# Patient Record
Sex: Female | Born: 1955 | Race: White | Hispanic: No | Marital: Married | Smoking: Never smoker
Health system: Southern US, Community
[De-identification: ages and names within clinical notes are randomized; demographics above are authoritative.]

---

## 2000-01-04 ENCOUNTER — Encounter: Payer: Self-pay | Admitting: Obstetrics and Gynecology

## 2000-01-04 ENCOUNTER — Encounter: Admission: RE | Admit: 2000-01-04 | Discharge: 2000-01-04 | Payer: Self-pay | Admitting: Obstetrics and Gynecology

## 2000-09-04 ENCOUNTER — Other Ambulatory Visit: Admission: RE | Admit: 2000-09-04 | Discharge: 2000-09-04 | Payer: Self-pay | Admitting: Obstetrics and Gynecology

## 2000-09-04 ENCOUNTER — Encounter (INDEPENDENT_AMBULATORY_CARE_PROVIDER_SITE_OTHER): Payer: Self-pay | Admitting: Specialist

## 2001-01-06 ENCOUNTER — Encounter: Admission: RE | Admit: 2001-01-06 | Discharge: 2001-01-06 | Payer: Self-pay | Admitting: Obstetrics and Gynecology

## 2001-01-06 ENCOUNTER — Encounter: Payer: Self-pay | Admitting: Obstetrics and Gynecology

## 2002-01-19 ENCOUNTER — Encounter: Payer: Self-pay | Admitting: Obstetrics and Gynecology

## 2002-01-19 ENCOUNTER — Encounter: Admission: RE | Admit: 2002-01-19 | Discharge: 2002-01-19 | Payer: Self-pay | Admitting: Obstetrics and Gynecology

## 2003-02-01 ENCOUNTER — Encounter: Admission: RE | Admit: 2003-02-01 | Discharge: 2003-02-01 | Payer: Self-pay | Admitting: Obstetrics and Gynecology

## 2004-02-06 ENCOUNTER — Encounter: Admission: RE | Admit: 2004-02-06 | Discharge: 2004-02-06 | Payer: Self-pay | Admitting: Obstetrics and Gynecology

## 2005-03-12 ENCOUNTER — Encounter: Admission: RE | Admit: 2005-03-12 | Discharge: 2005-03-12 | Payer: Self-pay | Admitting: Obstetrics and Gynecology

## 2006-03-13 ENCOUNTER — Encounter: Admission: RE | Admit: 2006-03-13 | Discharge: 2006-03-13 | Payer: Self-pay | Admitting: Obstetrics and Gynecology

## 2007-01-12 DIAGNOSIS — D649 Anemia, unspecified: Secondary | ICD-10-CM | POA: Insufficient documentation

## 2007-01-12 DIAGNOSIS — I4949 Other premature depolarization: Secondary | ICD-10-CM | POA: Insufficient documentation

## 2007-03-27 ENCOUNTER — Encounter: Admission: RE | Admit: 2007-03-27 | Discharge: 2007-03-27 | Payer: Self-pay | Admitting: Obstetrics and Gynecology

## 2008-08-15 ENCOUNTER — Encounter: Admission: RE | Admit: 2008-08-15 | Discharge: 2008-08-15 | Payer: Self-pay | Admitting: Family Medicine

## 2010-02-20 ENCOUNTER — Ambulatory Visit: Payer: Self-pay | Admitting: Pulmonary Disease

## 2010-02-20 DIAGNOSIS — G4700 Insomnia, unspecified: Secondary | ICD-10-CM | POA: Insufficient documentation

## 2010-04-24 NOTE — Assessment & Plan Note (Signed)
Summary: consult for insomnia   Visit Type:  Initial Consult Copy to:  Lavada Mesi MD Primary Provider/Referring Provider:  Jim Like MD  CC:  Sleep Consult. pt c/o having trouble falling asleep and staying asleep but it's not everynight. .  History of Present Illness: The pt is a 54y/o female who I have been asked to see for insomnia.  The pt has had issues with this for years, and states that she "cannot turn off brain".  She works 10 hr shifts which start at 8am or 11am, and will typically go to bed at 11pm and arise btw 6-8am.  She usually is not sleepy when she goes to bed, and takes an [place] and xanax everynight to help initiate sleep.  She can get to sleep quickly with meds on some nights, but others not.  She will awaken at least one time a night, then cannot get back to sleep at all.  She will typically stay in bed, and admits that she has a sense of frustration with trying to get to sleep.  She unfortunately will read and watch tv in bed.  She is not rested upon arising, but does not nap during the day.  She does drink 3 caffeinated beverages each am, but none after lunch.  She will occasionally doze during the evening, but not often.  Her epworth score today is only 6.  She does try and get exercise with walking 2-3 times a week.  She denies any environmental issues with her bedroom, chronic pain, but does have hot flashes of menopause.  She denies snoring and symptoms of RLS.    Preventive Screening-Counseling & Management  Alcohol-Tobacco     Smoking Status: never  Current Medications (verified): 1)  Voltaren-Xr 100 Mg Xr24h-Tab (Diclofenac Sodium) .... 75 Mg Two Times A Day 2)  Doxycycline Hyclate 100 Mg Caps (Doxycycline Hyclate) .... Once Daily 3)  Mega Red 300 Mg .... Once Daily 4)  Xanax 1 Mg Tabs (Alprazolam) .Marland Kitchen.. 1-2 Tablets As Needed 5)  Ambien 5 Mg Tabs (Zolpidem Tartrate) .... Once Daily At Bedtime  Allergies (verified): No Known Drug Allergies  Past  History:  Past Medical History: Arthritis  Past Surgical History: none  Family History: Reviewed history and no changes required. kidney cancer--father throat cancer--father htn--mother  Social History: Reviewed history and no changes required. occupation--pharmacist married Patient never smoked.  Smoking Status:  never  Review of Systems       The patient complains of tooth/dental problems, anxiety, and joint stiffness or pain.  The patient denies shortness of breath with activity, shortness of breath at rest, productive cough, non-productive cough, coughing up blood, chest pain, irregular heartbeats, acid heartburn, indigestion, loss of appetite, weight change, abdominal pain, difficulty swallowing, sore throat, headaches, nasal congestion/difficulty breathing through nose, sneezing, itching, ear ache, depression, hand/feet swelling, rash, change in color of mucus, and fever.    Vital Signs:  Patient profile:   55 year old female Height:      65.5 inches Weight:      146.25 pounds BMI:     24.05 O2 Sat:      100 % on Room air Temp:     98.2 degrees F oral Pulse rate:   65 / minute BP sitting:   122 / 80  (left arm) Cuff size:   large  Vitals Entered By: Carver Fila (February 20, 2010 9:05 AM)  O2 Flow:  Room air CC: Sleep Consult. pt c/o having trouble falling asleep and  staying asleep but it's not everynight.  Comments meds and allergies updated Phone number updated Carver Fila  February 20, 2010 9:06 AM    Physical Exam  General:  wd female in nad Eyes:  PERRLA and EOMI.   Nose:  mild narrowing on right, o/w clear Mouth:  no exudates or lesions seen Neck:  no jvd, tmg, LN Lungs:  totally clear to auscultation Heart:  rrr, no mrg Abdomen:  soft and nontender, bs+ Extremities:  no edema or cyanosis, pulses intact distally Neurologic:  alert and oriented, moves all 4.   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  She does work shifts, but does not overlap into 3rd shift.  I have had a long discussion with pt about insomnia, and have told her that medications are not the answer.  This will typically require behavioral therapy, and a lot of discipline on her part.  Will try to get her off ambien and xanax, using trazodone short term in the interim.  I have reviewed ritualistic behaviors and stimulus control therapy with her, and asked her to go to bed later than usual because she is not sleepy at her usual time.  I have also gone over what constitutes good sleep hygiene.  Finally, I have stressed to her this will be a slow process with small improvements over time.  If she continues to have issues, she may need consultation with a behavioral therapist for CBT (cognitive behavioral therapy).  Medications Added to Medication List This Visit: 1)  Voltaren-xr 100 Mg Xr24h-tab (Diclofenac sodium) .... 75 mg two times a day 2)  Doxycycline Hyclate 100 Mg Caps (Doxycycline hyclate) .... Once daily 3)  Mega Red 300 Mg  .... Once daily 4)  Xanax 1 Mg Tabs (Alprazolam) .Marland Kitchen.. 1-2 tablets as needed 5)  Ambien 5 Mg Tabs (Zolpidem tartrate) .... Once daily at bedtime 6)  Trazodone Hcl 50 Mg Tabs (Trazodone hcl) .... At bedtime  Other Orders: Consultation Level V (13086)  Patient Instructions: 1)  no tv or reading in bed ever. 2)  do not stay in bed more than if you cannot initiate sleep 3)  ritualistic behaviors for before going to bed to help "turn off brain" 4)  make bedtime 12mn, and get up everyday no later than 7am 5)  no caffeine after 10am 6)  stop ambien now, and take trazodone 50mg  at bedtime for next 3 weeks. 7)  decrease xanax to 0.5 each night for a week, then every other day for a week, then stop 8)  no napping during day, and exercise 3-4 hours before bedtime. 9)  call me in 3 weeks with your  progress.  Prescriptions: TRAZODONE HCL 50 MG  TABS (TRAZODONE HCL) At bedtime  #21 x 0   Entered and Authorized by:   Barbaraann Share MD   Signed by:   Barbaraann Share MD on 02/20/2010   Method used:   Print then Give to Patient   RxID:   [id number]

## 2011-08-08 DIAGNOSIS — N951 Menopausal and female climacteric states: Secondary | ICD-10-CM | POA: Insufficient documentation

## 2016-01-03 ENCOUNTER — Ambulatory Visit (INDEPENDENT_AMBULATORY_CARE_PROVIDER_SITE_OTHER): Payer: Self-pay | Admitting: Orthopedic Surgery

## 2016-01-24 ENCOUNTER — Ambulatory Visit (INDEPENDENT_AMBULATORY_CARE_PROVIDER_SITE_OTHER): Payer: 59 | Admitting: Orthopedic Surgery

## 2016-01-24 ENCOUNTER — Ambulatory Visit (INDEPENDENT_AMBULATORY_CARE_PROVIDER_SITE_OTHER): Payer: 59

## 2016-01-24 ENCOUNTER — Encounter (INDEPENDENT_AMBULATORY_CARE_PROVIDER_SITE_OTHER): Payer: Self-pay | Admitting: Orthopedic Surgery

## 2016-01-24 VITALS — BP 140/84 | Ht 65.5 in | Wt 138.0 lb

## 2016-01-24 DIAGNOSIS — M1711 Unilateral primary osteoarthritis, right knee: Secondary | ICD-10-CM

## 2016-01-24 DIAGNOSIS — M25562 Pain in left knee: Secondary | ICD-10-CM

## 2016-01-24 MED ORDER — LIDOCAINE HCL 1 % IJ SOLN
5.0000 mL | INTRAMUSCULAR | Status: AC | PRN
  Administered 2016-01-24: 5 mL

## 2016-01-24 MED ORDER — METHYLPREDNISOLONE ACETATE 40 MG/ML IJ SUSP
40.0000 mg | INTRAMUSCULAR | Status: AC | PRN
  Administered 2016-01-24: 40 mg via INTRA_ARTICULAR

## 2016-01-24 MED ORDER — BUPIVACAINE HCL 0.25 % IJ SOLN
4.0000 mL | INTRAMUSCULAR | Status: AC | PRN
  Administered 2016-01-24: 4 mL via INTRA_ARTICULAR

## 2016-01-24 NOTE — Progress Notes (Signed)
Office Visit Note   Patient: Kristen CoreBarbara M Lovan           Date of Birth: 1956/02/12           MRN: [id number] Visit Date: 01/24/2016 Requested by: No referring provider defined for this encounter. PCP: Jim LikeBenjamin Wilson, MD  Subjective: Chief Complaint  Patient presents with  . Left Knee - Pain  . 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  She did working about 32 hours a week standing on both of her legs.  She states that she's been favoring the left knee and reports some focal anterolateral patellar type pain which is preventing her from kneeling.  Denies any mechanical symptoms.  She's been using Voltaren gel.  She has injection history well documented in the next sections  Patient comes in complaining of bilateral knee pain, R > L.  The right knee was last aspirated and injected with cortisone on 11/09/14, and Monovisc was last injected on 09/28/14.  The right knee still swells intermittently, she tolerates it.  She takes ultram for the pain.  Has put off coming for eval due to high insurance deductible.  The left knee hurts anterior/lateral, some pain, some swelling.  Very tender spot on knee, that is she kneels it is severely painful.  She wonders if it is just bruised, but does not remember any injury to it.  She favors the left knee due to the right knee pain.  Neither knee is giving way.                  Review of Systems all systems reviewed negative as they relate to the chief complaint.  No fevers or chills   Assessment & Plan: Visit Diagnoses: No diagnosis found.  Plan: Impression is progressive right knee arthritis but with maintenance of joint space and no effusion in the right knee.  She also is having some left knee pain which looks to be less arthritic and degenerative potentially more strangulated of tissue attachment site on that superolateral patella.  Did inject  the knee today and we'll continue with nonoperative management for now  Follow-Up Instructions: No Follow-up on file.   Orders:  No orders of the defined types were placed in this encounter.  No orders of the defined types were placed in this encounter.     Procedures: Large Joint Inj Date/Time: 01/24/2016 8:59 AM Performed by: Cammy CopaEAN, SCOTT GREGORY Authorized by: Cammy CopaEAN, SCOTT GREGORY   Consent Given by:  Patient Site marked: the procedure site was marked   Timeout: prior to procedure the correct patient, procedure, and site was verified   Indications:  Pain, joint swelling and diagnostic evaluation Location:  Knee Site:  R knee Prep: patient was prepped and draped in usual sterile fashion   Needle Size:  18 G Needle Length:  1.5 inches Approach:  Superolateral Ultrasound Guidance: No   Fluoroscopic Guidance: No   Arthrogram: No Medications:  5 mL lidocaine 1 %; 4 mL bupivacaine 0.25 %; 40 mg methylPREDNISolone acetate 40 MG/ML Aspiration Attempted: No   Patient tolerance:  Patient tolerated the procedure well with no immediate complications     Clinical Data: No additional findings.  Objective: Vital Signs: BP 140/84   Ht 5' 5.5" (1.664 m)   Wt 138 lb (62.6 kg)   BMI 22.62 kg/m   Physical Exam  Constitutional: She appears well-developed.  HENT:  Head: Normocephalic.  Eyes: EOM are normal.  Neck: Normal range of motion.  Cardiovascular: Normal rate.   Pulmonary/Chest: Effort normal.  Neurological: She is alert.  Skin: Skin is warm.  Psychiatric: She has a normal mood and affect.    Ortho Exam examination of both knees demonstrates no effusion full range of motion stable collateral cruciate ligament bilaterally palpable pedal pulses bilaterally no groin pain with internal/external rotation of either leg.  Does have focal tenderness superior lateral aspect of the patella on the left.  Negative Tinel's in this region.  No other masses lymph adenopathy or skin  changes noted in the right or left knee region.  There is no effusion in either knee.  No joint line tenderness is present on the right with mild patellofemoral crepitus more on the right than the left  Specialty Comments:  No specialty comments available.  Imaging: No results found.   PMFS History: Patient Active Problem List   Diagnosis Date Noted  . PERSISTENT DISORDER INITIATING/MAINTAINING SLEEP 02/20/2010   No past medical history on file.  No family history on file.  No past surgical history on file. Social History   Occupational History  . Not on file.   Social History Main Topics  . Smoking status: Never Smoker  . Smokeless tobacco: Never Used  . Alcohol use 0.6 oz/week    1 Glasses of wine per week     Comment: occas with dinner  . Drug use: No  . Sexual activity: Not on file

## 2016-01-30 ENCOUNTER — Other Ambulatory Visit (INDEPENDENT_AMBULATORY_CARE_PROVIDER_SITE_OTHER): Payer: Self-pay | Admitting: Orthopedic Surgery

## 2016-01-30 NOTE — Telephone Encounter (Signed)
Dean patient,can you advise ok to refill?

## 2016-01-31 NOTE — Telephone Encounter (Signed)
I called pharm [facility] for them, Rx did not send

## 2016-04-29 ENCOUNTER — Other Ambulatory Visit (INDEPENDENT_AMBULATORY_CARE_PROVIDER_SITE_OTHER): Payer: Self-pay | Admitting: Orthopedic Surgery

## 2016-04-29 NOTE — Telephone Encounter (Signed)
Rx request 

## 2016-05-10 ENCOUNTER — Other Ambulatory Visit (INDEPENDENT_AMBULATORY_CARE_PROVIDER_SITE_OTHER): Payer: Self-pay | Admitting: Orthopedic Surgery

## 2016-05-10 NOTE — Telephone Encounter (Signed)
Rx request 

## 2016-06-28 ENCOUNTER — Other Ambulatory Visit (INDEPENDENT_AMBULATORY_CARE_PROVIDER_SITE_OTHER): Payer: Self-pay | Admitting: Orthopedic Surgery

## 2016-06-28 NOTE — Telephone Encounter (Signed)
Rx requests 

## 2016-06-28 NOTE — Telephone Encounter (Signed)
IC tramadol Rx in to her pharm

## 2016-08-07 ENCOUNTER — Ambulatory Visit (INDEPENDENT_AMBULATORY_CARE_PROVIDER_SITE_OTHER): Payer: 59 | Admitting: Orthopedic Surgery

## 2016-08-07 ENCOUNTER — Encounter (INDEPENDENT_AMBULATORY_CARE_PROVIDER_SITE_OTHER): Payer: Self-pay | Admitting: Orthopedic Surgery

## 2016-08-07 DIAGNOSIS — G8929 Other chronic pain: Secondary | ICD-10-CM | POA: Diagnosis not present

## 2016-08-07 DIAGNOSIS — M25561 Pain in right knee: Secondary | ICD-10-CM

## 2016-08-07 MED ORDER — LIDOCAINE HCL 1 % IJ SOLN
5.0000 mL | INTRAMUSCULAR | Status: AC | PRN
  Administered 2016-08-07: 5 mL

## 2016-08-07 MED ORDER — METHYLPREDNISOLONE ACETATE 40 MG/ML IJ SUSP
40.0000 mg | INTRAMUSCULAR | Status: AC | PRN
  Administered 2016-08-07: 40 mg via INTRA_ARTICULAR

## 2016-08-07 MED ORDER — BUPIVACAINE HCL 0.25 % IJ SOLN
4.0000 mL | INTRAMUSCULAR | Status: AC | PRN
  Administered 2016-08-07: 4 mL via INTRA_ARTICULAR

## 2016-08-07 MED ORDER — METHOCARBAMOL 500 MG PO TABS: 500.0000 mg | ORAL_TABLET | Freq: Three times a day (TID) | ORAL | 0 refills | Status: DC | PRN

## 2016-08-07 NOTE — Progress Notes (Signed)
Office Visit Note   Patient: Kristen Maxwell           Date of Birth: [birth year]           MRN: [id number] Visit Date: 08/07/2016 Requested by: No referring provider defined for this encounter. PCP: System, Pcp Not In  Subjective: Chief Complaint  Patient presents with  . 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  She's had injections in the past.  Last cortisone injection 01/24/2016 which didn't help her.  She is using a knee sleeve also.  She also reports little bit of muscle spasm type pain from a pulled muscle in her back.  She's been taking some Robaxin which is about 62 years old and it's been helping.  She liked the prescription today.  Denies much in the way of radicular symptoms or any red flag symptoms with that pulled muscle in her back.  In regards to the knee she denies any mechanical symptoms but reports primarily medial sided pain.              ROS: All systems reviewed are negative as they relate to the chief complaint within the history of present illness.  Patient denies  fevers or chills.   Assessment & Plan: Visit Diagnoses:  1. Chronic pain of right knee     Plan: Impression is right knee pain and progressive arthritis.  Plan is injection which is performed today.  She also has pulled muscle in her back but no nerve root tension signs.  We'll try some Robaxin and she'll continue taking ibuprofen.  If that doesn't improve she'll need further workup.  In regards to her knee we did replace the knee sleeve today.  I'll see her back as needed  Follow-Up Instructions: Return if symptoms worsen or fail to improve.   Orders:  No orders of the defined types were placed in this encounter.  No orders of the defined types were placed in this encounter.     Procedures: Large Joint Inj Date/Time: 08/07/2016 9:22 AM Performed by: Cammy Copa Authorized by: Cammy Copa   Consent Given by:   Patient Site marked: the procedure site was marked   Timeout: prior to procedure the correct patient, procedure, and site was verified   Indications:  Pain, joint swelling and diagnostic evaluation Location:  Knee Site:  R knee Prep: patient was prepped and draped in usual sterile fashion   Needle Size:  18 G Needle Length:  1.5 inches Approach:  Superolateral Ultrasound Guidance: No   Fluoroscopic Guidance: No   Arthrogram: No   Medications:  5 mL lidocaine 1 %; 4 mL bupivacaine 0.25 %; 40 mg methylPREDNISolone acetate 40 MG/ML Patient tolerance:  Patient tolerated the procedure well with no immediate complications     Clinical Data: No additional findings.  Objective: Vital Signs: There were no vitals taken for this visit.  Physical Exam:   Constitutional: Patient appears well-developed HEENT:  Head: Normocephalic Eyes:EOM are normal Neck: Normal range of motion Cardiovascular: Normal rate Pulmonary/chest: Effort normal Neurologic: Patient is alert Skin: Skin is warm Psychiatric: Patient has normal mood and affect    Ortho Exam: Orthopedic exam demonstrates full active and passive range of motion of the knee with trace effusion.  He does joint line tenderness is present.  Pedal pulses palpable.  Negative patellar apprehension.  Specialty Comments:  No specialty comments available.  Imaging: No results found.  PMFS History: Patient Active Problem List   Diagnosis Date Noted  . PERSISTENT DISORDER INITIATING/MAINTAINING SLEEP 02/20/2010   No past medical history on file.  No family history on file.  No past surgical history on file. Social History   Occupational History  . Not on file.   Social History Main Topics  . Smoking status: Never Smoker  . Smokeless tobacco: Never Used  . Alcohol use 0.6 oz/week    1 Glasses of wine per week     Comment: occas with dinner  . Drug use: No  . Sexual activity: Not on file

## 2016-08-07 NOTE — Addendum Note (Signed)
Addended by: Rise PaganiniEAN, Kemp Gomes on: 08/07/2016 09:23 AM   Modules accepted: Orders

## 2016-10-13 ENCOUNTER — Other Ambulatory Visit (INDEPENDENT_AMBULATORY_CARE_PROVIDER_SITE_OTHER): Payer: Self-pay | Admitting: Orthopedic Surgery

## 2016-10-14 NOTE — Telephone Encounter (Signed)
Ok to rf? 

## 2016-10-15 NOTE — Telephone Encounter (Signed)
y

## 2016-11-06 ENCOUNTER — Other Ambulatory Visit (INDEPENDENT_AMBULATORY_CARE_PROVIDER_SITE_OTHER): Payer: Self-pay | Admitting: Orthopaedic Surgery

## 2016-11-06 NOTE — Telephone Encounter (Signed)
Please advise 

## 2016-12-09 ENCOUNTER — Other Ambulatory Visit (INDEPENDENT_AMBULATORY_CARE_PROVIDER_SITE_OTHER): Payer: Self-pay | Admitting: Orthopedic Surgery

## 2016-12-09 NOTE — Telephone Encounter (Signed)
Please advise. Last seen 08/07/16.

## 2016-12-12 ENCOUNTER — Other Ambulatory Visit (INDEPENDENT_AMBULATORY_CARE_PROVIDER_SITE_OTHER): Payer: Self-pay | Admitting: Orthopedic Surgery

## 2016-12-12 NOTE — Telephone Encounter (Signed)
How many filled 6/14

## 2016-12-12 NOTE — Telephone Encounter (Signed)
30

## 2016-12-12 NOTE — Telephone Encounter (Signed)
thx

## 2016-12-12 NOTE — Telephone Encounter (Signed)
Ok to rf? 

## 2016-12-12 NOTE — Telephone Encounter (Signed)
Patient last seen in May. Please advise. Thanks.

## 2016-12-12 NOTE — Telephone Encounter (Signed)
[facility] pharmacy. Advised that per Dr August Saucer we were only going to be able to fill for her to take BID #[id number]. He stated he would have patient call our office. 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 Will wait for patient to call.

## 2017-05-16 DIAGNOSIS — H3410 Central retinal artery occlusion, unspecified eye: Secondary | ICD-10-CM | POA: Insufficient documentation

## 2017-06-02 ENCOUNTER — Other Ambulatory Visit (INDEPENDENT_AMBULATORY_CARE_PROVIDER_SITE_OTHER): Payer: Self-pay | Admitting: Orthopedic Surgery

## 2017-06-02 NOTE — Telephone Encounter (Signed)
Ok to rf? 

## 2017-06-03 NOTE — Telephone Encounter (Signed)
y

## 2017-06-27 DIAGNOSIS — N939 Abnormal uterine and vaginal bleeding, unspecified: Secondary | ICD-10-CM | POA: Insufficient documentation

## 2017-06-27 DIAGNOSIS — N840 Polyp of corpus uteri: Secondary | ICD-10-CM | POA: Insufficient documentation

## 2017-07-10 ENCOUNTER — Other Ambulatory Visit (INDEPENDENT_AMBULATORY_CARE_PROVIDER_SITE_OTHER): Payer: Self-pay | Admitting: Orthopedic Surgery

## 2017-07-10 NOTE — Telephone Encounter (Signed)
Ok to rf? 

## 2017-07-12 NOTE — Telephone Encounter (Signed)
Y can you see if she wants rx for duexis

## 2017-07-24 ENCOUNTER — Ambulatory Visit (INDEPENDENT_AMBULATORY_CARE_PROVIDER_SITE_OTHER): Payer: 59 | Admitting: Orthopedic Surgery

## 2017-08-06 ENCOUNTER — Ambulatory Visit (INDEPENDENT_AMBULATORY_CARE_PROVIDER_SITE_OTHER): Payer: 59 | Admitting: Orthopedic Surgery

## 2017-08-07 ENCOUNTER — Ambulatory Visit (INDEPENDENT_AMBULATORY_CARE_PROVIDER_SITE_OTHER): Payer: 59 | Admitting: Orthopedic Surgery

## 2017-08-13 ENCOUNTER — Encounter (INDEPENDENT_AMBULATORY_CARE_PROVIDER_SITE_OTHER): Payer: Self-pay | Admitting: Orthopedic Surgery

## 2017-08-13 ENCOUNTER — Ambulatory Visit (INDEPENDENT_AMBULATORY_CARE_PROVIDER_SITE_OTHER): Payer: 59 | Admitting: Orthopedic Surgery

## 2017-08-13 DIAGNOSIS — M1711 Unilateral primary osteoarthritis, right knee: Secondary | ICD-10-CM

## 2017-08-13 MED ORDER — METHYLPREDNISOLONE ACETATE 40 MG/ML IJ SUSP
40.0000 mg | INTRAMUSCULAR | Status: AC | PRN
  Administered 2017-08-13: 40 mg via INTRA_ARTICULAR

## 2017-08-13 MED ORDER — LIDOCAINE HCL 1 % IJ SOLN
5.0000 mL | INTRAMUSCULAR | Status: AC | PRN
Start: 2017-08-13 — End: 2017-08-13
  Administered 2017-08-13: 5 mL

## 2017-08-13 MED ORDER — BUPIVACAINE HCL 0.25 % IJ SOLN
4.0000 mL | INTRAMUSCULAR | Status: AC | PRN
  Administered 2017-08-13: 4 mL via INTRA_ARTICULAR

## 2017-08-13 NOTE — Progress Notes (Signed)
Office Visit Note   Patient: Kristen Maxwell           Date of Birth: April 08, 1955           MRN: [id number] Visit Date: 08/13/2017 Requested by: No referring provider defined for this encounter. PCP: System, Pcp Not In  Subjective: Chief Complaint  Patient presents with  . Right Knee - Pain, Follow-up    HPI: Kristen Maxwell is a patient with right knee pain.  Has known history of right knee arthritis.  Last injection given 08/07/2016.  She reports pain.  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  She wears a knee brace when she works.  She takes tramadol for her symptoms 3 times a day.              ROS: All systems reviewed are negative as they relate to the chief complaint within the history of present illness.  Patient denies  fevers or chills.   Assessment & Plan: Visit Diagnoses:  1. Unilateral primary osteoarthritis, right knee     Plan: Impression is recurrent and refractory right knee arthritis.  Last injection a year ago.  Patient is done well with these injections.  Right knee injected again today.  We will see how that works for her.  She will need knee replacement sometime in the future but for now she is managing well with conservative measures.  Follow-up as needed  Follow-Up Instructions: Return if symptoms worsen or fail to improve.   Orders:  No orders of the defined types were placed in this encounter.  No orders of the defined types were placed in this encounter.     Procedures: Large Joint Inj: R knee on 08/13/2017 3:14 PM Indications: diagnostic evaluation, joint swelling and pain Details: 18 G 1.5 in needle, superolateral approach  Arthrogram: No  Medications: 5 mL lidocaine 1 %; 40 mg methylPREDNISolone acetate 40 MG/ML; 4 mL bupivacaine 0.25 % Outcome: tolerated well, no immediate complications Procedure, treatment alternatives, risks and benefits  explained, specific risks discussed. Consent was given by the patient. Immediately prior to procedure a time out was called to verify the correct patient, procedure, equipment, support staff and site/side marked as required. Patient was prepped and draped in the usual sterile fashion.       Clinical Data: No additional findings.  Objective: Vital Signs: There were no vitals taken for this visit.  Physical Exam:   Constitutional: Patient appears well-developed HEENT:  Head: Normocephalic Eyes:EOM are normal Neck: Normal range of motion Cardiovascular: Normal rate Pulmonary/chest: Effort normal Neurologic: Patient is alert Skin: Skin is warm Psychiatric: Patient has normal mood and affect    Ortho Exam: Orthopedic exam demonstrates good muscle tone in bilateral legs.  No real right knee effusion.  Collateral and cruciate ligaments are stable.  Extensor mechanism is intact.  No other masses lymphadenopathy or skin changes noted in that right knee region.  She has full range of motion.  No focal joint line tenderness is present.  Specialty Comments:  No specialty comments available.  Imaging: No results found.   PMFS History: Patient Active Problem List   Diagnosis Date Noted  . PERSISTENT DISORDER INITIATING/MAINTAINING SLEEP 02/20/2010   History reviewed. No pertinent past medical history.  History reviewed. No pertinent family history.  History reviewed. No pertinent surgical history. Social History   Occupational History  .  Not on file  Tobacco Use  . Smoking status: Never Smoker  . Smokeless tobacco: Never Used  Substance and Sexual Activity  . Alcohol use: Yes    Alcohol/week: 0.6 oz    Types: 1 Glasses of wine per week    Comment: occas with dinner  . Drug use: No  . Sexual activity: Not on file

## 2017-08-19 DIAGNOSIS — D6859 Other primary thrombophilia: Secondary | ICD-10-CM | POA: Insufficient documentation

## 2017-08-19 DIAGNOSIS — D500 Iron deficiency anemia secondary to blood loss (chronic): Secondary | ICD-10-CM | POA: Insufficient documentation

## 2017-09-19 ENCOUNTER — Telehealth: Payer: Self-pay

## 2017-09-19 NOTE — Telephone Encounter (Signed)
SENT REFERRAL TO SCHEDULING AND FILED NOTES 

## 2017-09-29 ENCOUNTER — Telehealth (INDEPENDENT_AMBULATORY_CARE_PROVIDER_SITE_OTHER): Payer: Self-pay

## 2017-09-29 MED ORDER — DICLOFENAC SODIUM 1 % TD GEL: 2.0000 g | Freq: Two times a day (BID) | TRANSDERMAL | 1 refills | Status: DC

## 2017-09-29 NOTE — Telephone Encounter (Signed)
Patients pharmacy requested diclofenac 1% for patient I sent in rx.

## 2017-09-30 NOTE — Telephone Encounter (Signed)
thx

## 2017-12-11 ENCOUNTER — Telehealth (INDEPENDENT_AMBULATORY_CARE_PROVIDER_SITE_OTHER): Payer: Self-pay

## 2017-12-11 ENCOUNTER — Other Ambulatory Visit (INDEPENDENT_AMBULATORY_CARE_PROVIDER_SITE_OTHER): Payer: Self-pay

## 2017-12-11 MED ORDER — DICLOFENAC SODIUM 1 % TD GEL: TRANSDERMAL | 1 refills | Status: DC

## 2017-12-11 NOTE — Telephone Encounter (Signed)
Patients pharmacy request rxrf for patient for voltaren gel. I sent in refill.

## 2017-12-15 NOTE — Telephone Encounter (Signed)
Ok to rf pls cla lthx

## 2017-12-17 ENCOUNTER — Telehealth (INDEPENDENT_AMBULATORY_CARE_PROVIDER_SITE_OTHER): Payer: Self-pay

## 2017-12-17 ENCOUNTER — Ambulatory Visit (INDEPENDENT_AMBULATORY_CARE_PROVIDER_SITE_OTHER): Payer: 59 | Admitting: Orthopedic Surgery

## 2017-12-17 ENCOUNTER — Encounter (INDEPENDENT_AMBULATORY_CARE_PROVIDER_SITE_OTHER): Payer: Self-pay | Admitting: Orthopedic Surgery

## 2017-12-17 DIAGNOSIS — M1711 Unilateral primary osteoarthritis, right knee: Secondary | ICD-10-CM | POA: Diagnosis not present

## 2017-12-17 MED ORDER — BUPIVACAINE HCL 0.25 % IJ SOLN
4.0000 mL | INTRAMUSCULAR | Status: AC | PRN
  Administered 2017-12-17: 4 mL via INTRA_ARTICULAR

## 2017-12-17 MED ORDER — METHYLPREDNISOLONE ACETATE 40 MG/ML IJ SUSP
40.0000 mg | INTRAMUSCULAR | Status: AC | PRN
  Administered 2017-12-17: 40 mg via INTRA_ARTICULAR

## 2017-12-17 MED ORDER — LIDOCAINE HCL 1 % IJ SOLN
5.0000 mL | INTRAMUSCULAR | Status: AC | PRN
  Administered 2017-12-17: 5 mL

## 2017-12-17 MED ORDER — TRAMADOL HCL 50 MG PO TABS: 50.0000 mg | ORAL_TABLET | Freq: Three times a day (TID) | ORAL | 0 refills | Status: DC | PRN

## 2017-12-17 NOTE — Telephone Encounter (Signed)
Can you please check on the long acting injection that Dr August Saucer had discussed with you for this patient?

## 2017-12-17 NOTE — Progress Notes (Signed)
Office Visit Note   Patient: Kristen Maxwell           Date of Birth: 1955-05-19           MRN: [id number] Visit Date: 12/17/2017 Requested by: No referring provider defined for this encounter. PCP: System, Pcp Not In  Subjective: Chief Complaint  Patient presents with  . 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  Has known history of right knee arthritis.  Had prior knee injection in May of this year.  She would like to have another one done today.  She has call weekend coming up which involves a lot of standing.  Reports pain primarily medially and some posterior medially.  Ice helps a lot.              ROS: All systems reviewed are negative as they relate to the chief complaint within the history of present illness.  Patient denies  fevers or chills.   Assessment & Plan: Visit Diagnoses:  1. Unilateral primary osteoarthritis, right knee     Plan: Impression is right knee arthritis.  Plan is aspiration and injection of that right knee.  I think we will also look at getting some long-term injectable extended release triamcinolone.  I will see her back as needed.  We will preapproved for for Synvisc gel as well.  She is had that before and it also helps.  Follow-Up Instructions: Return if symptoms worsen or fail to improve.   Orders:  No orders of the defined types were placed in this encounter.  Meds ordered this encounter  Medications  . traMADol (ULTRAM) 50 MG tablet    Sig: Take 1 tablet (50 mg total) by mouth every 8 (eight) hours as needed.    Dispense:  45 tablet    Refill:  0      Procedures: Large Joint Inj: R knee on 12/17/2017 12:14 PM Indications: diagnostic evaluation, joint swelling and pain Details: 18 G 1.5 in needle, superolateral approach  Arthrogram: No  Medications: 5 mL lidocaine 1 %; 40 mg methylPREDNISolone acetate 40 MG/ML; 4 mL bupivacaine 0.25 % Outcome: tolerated well, no immediate  complications Procedure, treatment alternatives, risks and benefits explained, specific risks discussed. Consent was given by the patient. Immediately prior to procedure a time out was called to verify the correct patient, procedure, equipment, support staff and site/side marked as required. Patient was prepped and draped in the usual sterile fashion.       Clinical Data: No additional findings.  Objective: Vital Signs: There were no vitals taken for this visit.  Physical Exam:   Constitutional: Patient appears well-developed HEENT:  Head: Normocephalic Eyes:EOM are normal Neck: Normal range of motion Cardiovascular: Normal rate Pulmonary/chest: Effort normal Neurologic: Patient is alert Skin: Skin is warm Psychiatric: Patient has normal mood and affect    Ortho Exam: Ortho exam demonstrates full range of motion of that right knee with trace effusion.  Medial joint line tenderness is present.  Collateral and cruciate ligaments are stable.  No other masses lymph adenopathy or skin changes noted in that right knee region.  Specialty Comments:  No specialty comments available.  Imaging: No results found.   PMFS History: Patient Active Problem List   Diagnosis Date Noted  . PERSISTENT DISORDER INITIATING/MAINTAINING SLEEP 02/20/2010   History reviewed. No pertinent past medical history.  History reviewed. No pertinent family history.  History reviewed. No pertinent surgical history. Social History  Occupational History  . Not on file  Tobacco Use  . Smoking status: Never Smoker  . Smokeless tobacco: Never Used  Substance and Sexual Activity  . Alcohol use: Yes    Alcohol/week: 1.0 standard drinks    Types: 1 Glasses of wine per week    Comment: occas with dinner  . Drug use: No  . Sexual activity: Not on file

## 2017-12-17 NOTE — Telephone Encounter (Signed)
Can we please get patient approved for right knee gel injection? Thanks.

## 2017-12-18 ENCOUNTER — Telehealth (INDEPENDENT_AMBULATORY_CARE_PROVIDER_SITE_OTHER): Payer: Self-pay

## 2017-12-18 NOTE — Telephone Encounter (Signed)
This is the one that Dr August Saucer had talked to you about yesterday.  Can you see if we can get this for the patient?

## 2017-12-18 NOTE — Telephone Encounter (Signed)
Noted  

## 2017-12-18 NOTE — Telephone Encounter (Signed)
Submitted VOB for Monovisc, right knee. 

## 2017-12-18 NOTE — Telephone Encounter (Signed)
If you can get this for her for a one-time dose but I do not think we will do it in general for every body

## 2017-12-24 ENCOUNTER — Telehealth (INDEPENDENT_AMBULATORY_CARE_PROVIDER_SITE_OTHER): Payer: Self-pay

## 2017-12-24 NOTE — Telephone Encounter (Signed)
Talked with patient and advised her that she is approved for Monovisc, right knee. [place] After deductible has been met, patient will be responsible for 20% of the allowable amount. No Co-pay No PA required  Appt.scheduled 01/07/2018 with Dr. Marlou Sa

## 2018-01-07 ENCOUNTER — Encounter (INDEPENDENT_AMBULATORY_CARE_PROVIDER_SITE_OTHER): Payer: Self-pay | Admitting: Orthopedic Surgery

## 2018-01-07 ENCOUNTER — Ambulatory Visit (INDEPENDENT_AMBULATORY_CARE_PROVIDER_SITE_OTHER): Payer: 59 | Admitting: Orthopedic Surgery

## 2018-01-07 ENCOUNTER — Telehealth (INDEPENDENT_AMBULATORY_CARE_PROVIDER_SITE_OTHER): Payer: Self-pay

## 2018-01-07 DIAGNOSIS — M1711 Unilateral primary osteoarthritis, right knee: Secondary | ICD-10-CM

## 2018-01-07 MED ORDER — LIDOCAINE HCL 1 % IJ SOLN
5.0000 mL | INTRAMUSCULAR | Status: AC | PRN
  Administered 2018-01-07: 5 mL

## 2018-01-07 MED ORDER — HYALURONAN 88 MG/4ML IX SOSY
88.0000 mg | PREFILLED_SYRINGE | INTRA_ARTICULAR | Status: AC | PRN
  Administered 2018-01-07: 88 mg via INTRA_ARTICULAR

## 2018-01-07 NOTE — Telephone Encounter (Signed)
Can we get patient approved for the cortisone that Dr August Saucer had spoken with you about? He said that he wanted to try it in her knee.

## 2018-01-07 NOTE — Progress Notes (Signed)
   Procedure Note  Patient: Kristen Maxwell             Date of Birth: 1955-07-03           MRN: [id number]             Visit Date: 01/07/2018  Procedures: Visit Diagnoses: Primary osteoarthritis of right knee  Large Joint Inj: R knee on 01/07/2018 1:51 PM Indications: pain, joint swelling and diagnostic evaluation Details: 18 G 1.5 in needle, superolateral approach  Arthrogram: No  Medications: 5 mL lidocaine 1 %; 88 mg Hyaluronan 88 MG/4ML Outcome: tolerated well, no immediate complications Procedure, treatment alternatives, risks and benefits explained, specific risks discussed. Consent was given by the patient. Immediately prior to procedure a time out was called to verify the correct patient, procedure, equipment, support staff and site/side marked as required. Patient was prepped and draped in the usual sterile fashion.

## 2018-01-13 ENCOUNTER — Telehealth: Payer: Self-pay

## 2018-01-13 NOTE — Telephone Encounter (Signed)
SENT REFERRAL TO SCHEDULING AND FILED NOTES 

## 2018-01-15 NOTE — Telephone Encounter (Signed)
thx

## 2018-01-15 NOTE — Telephone Encounter (Signed)
FYI

## 2018-01-15 NOTE — Telephone Encounter (Signed)
I spoke to patient, she is feeling better at this time, since last injection.  She will call me directly when she is ready to get the zilretta injeciton, and I will schedule her and order the medication. FYI

## 2018-02-20 ENCOUNTER — Other Ambulatory Visit (INDEPENDENT_AMBULATORY_CARE_PROVIDER_SITE_OTHER): Payer: Self-pay | Admitting: Orthopedic Surgery

## 2018-02-23 NOTE — Telephone Encounter (Signed)
y

## 2018-02-23 NOTE — Telephone Encounter (Signed)
Rx request 

## 2018-03-02 ENCOUNTER — Other Ambulatory Visit (INDEPENDENT_AMBULATORY_CARE_PROVIDER_SITE_OTHER): Payer: Self-pay

## 2018-03-02 MED ORDER — IBUPROFEN 800 MG PO TABS: ORAL_TABLET | ORAL | 0 refills | Status: DC

## 2018-03-30 ENCOUNTER — Other Ambulatory Visit (INDEPENDENT_AMBULATORY_CARE_PROVIDER_SITE_OTHER): Payer: Self-pay | Admitting: Orthopedic Surgery

## 2018-03-30 ENCOUNTER — Telehealth (INDEPENDENT_AMBULATORY_CARE_PROVIDER_SITE_OTHER): Payer: Self-pay

## 2018-03-30 NOTE — Telephone Encounter (Signed)
Ok for refill? 

## 2018-03-30 NOTE — Telephone Encounter (Signed)
y

## 2018-03-30 NOTE — Telephone Encounter (Signed)
Called and lm on vm to advise ok refill tramadol 50 MG 1 PO Q 8 PRN # 24 per Dr. August Saucer.

## 2018-04-01 ENCOUNTER — Other Ambulatory Visit (INDEPENDENT_AMBULATORY_CARE_PROVIDER_SITE_OTHER): Payer: Self-pay | Admitting: Orthopedic Surgery

## 2018-04-01 NOTE — Telephone Encounter (Signed)
Ok for refill? 

## 2018-04-02 NOTE — Telephone Encounter (Signed)
y

## 2018-04-02 NOTE — Telephone Encounter (Signed)
Called to pharmacy 

## 2018-04-16 ENCOUNTER — Telehealth (INDEPENDENT_AMBULATORY_CARE_PROVIDER_SITE_OTHER): Payer: Self-pay

## 2018-04-16 ENCOUNTER — Other Ambulatory Visit (INDEPENDENT_AMBULATORY_CARE_PROVIDER_SITE_OTHER): Payer: Self-pay

## 2018-04-16 MED ORDER — TRAMADOL HCL 50 MG PO TABS: 50.0000 mg | ORAL_TABLET | Freq: Two times a day (BID) | ORAL | 0 refills | Status: DC

## 2018-04-16 NOTE — Telephone Encounter (Signed)
Patient would like to know if a 30 day supply of Tramadol could be sent to her pharmacy?  Stated that she was being prescribed #24, but it would be cheaper for a 30 day supply.  [facility] to [facility] [place] [facility] patient.  Cb# is [phone].  Please advise.  Thank you.

## 2018-04-16 NOTE — Telephone Encounter (Signed)
Ok for 60 pls clal thx 1 po bid

## 2018-04-16 NOTE — Telephone Encounter (Signed)
Called Rx [facility] [place], per message below by Dr. August Saucer.

## 2018-05-15 ENCOUNTER — Telehealth (INDEPENDENT_AMBULATORY_CARE_PROVIDER_SITE_OTHER): Payer: Self-pay | Admitting: Radiology

## 2018-05-15 NOTE — Telephone Encounter (Signed)
Patient called, LMVM saying that the anti inflammatory we approved her for a while back, she wants to try it.  She Brahim Dolman be referring to the Zilretta.  However she was approved for Monovisc too.   I will look into this and call her back about it.

## 2018-05-20 NOTE — Telephone Encounter (Signed)
Called and advised pt of message below

## 2018-05-20 NOTE — Telephone Encounter (Signed)
Will you please call this patient and tell her that I am working on this, and will call her to discuss and schedule it, if she wants?

## 2018-05-28 ENCOUNTER — Other Ambulatory Visit (INDEPENDENT_AMBULATORY_CARE_PROVIDER_SITE_OTHER): Payer: Self-pay | Admitting: Orthopedic Surgery

## 2018-05-28 NOTE — Telephone Encounter (Signed)
appt made, will order meds for her appt 06/25/18

## 2018-05-28 NOTE — Telephone Encounter (Signed)
y

## 2018-05-28 NOTE — Telephone Encounter (Signed)
Ok to rf? 

## 2018-06-04 ENCOUNTER — Ambulatory Visit (INDEPENDENT_AMBULATORY_CARE_PROVIDER_SITE_OTHER): Payer: 59 | Admitting: Family Medicine

## 2018-06-17 ENCOUNTER — Encounter (INDEPENDENT_AMBULATORY_CARE_PROVIDER_SITE_OTHER): Payer: Self-pay | Admitting: Radiology

## 2018-06-17 NOTE — Telephone Encounter (Signed)
mychart message sent to patient to inquire.

## 2018-06-17 NOTE — Telephone Encounter (Signed)
This is the one I emailed you about, let me know if we are going to proceed, thanks.

## 2018-06-17 NOTE — Telephone Encounter (Signed)
Dr August Saucer said that it was up to the patient.

## 2018-06-24 ENCOUNTER — Telehealth (INDEPENDENT_AMBULATORY_CARE_PROVIDER_SITE_OTHER): Payer: Self-pay | Admitting: Radiology

## 2018-06-24 NOTE — Telephone Encounter (Signed)
Answered no to all screening questions. 

## 2018-06-25 ENCOUNTER — Telehealth (INDEPENDENT_AMBULATORY_CARE_PROVIDER_SITE_OTHER): Payer: Self-pay

## 2018-06-25 ENCOUNTER — Ambulatory Visit (INDEPENDENT_AMBULATORY_CARE_PROVIDER_SITE_OTHER): Payer: 59 | Admitting: Orthopedic Surgery

## 2018-06-25 ENCOUNTER — Encounter (INDEPENDENT_AMBULATORY_CARE_PROVIDER_SITE_OTHER): Payer: Self-pay | Admitting: Orthopedic Surgery

## 2018-06-25 ENCOUNTER — Other Ambulatory Visit: Payer: Self-pay

## 2018-06-25 DIAGNOSIS — M1711 Unilateral primary osteoarthritis, right knee: Secondary | ICD-10-CM | POA: Diagnosis not present

## 2018-06-25 MED ORDER — TRAMADOL HCL 50 MG PO TABS: ORAL_TABLET | ORAL | 0 refills | Status: DC

## 2018-06-25 MED ORDER — CELECOXIB 100 MG PO CAPS: ORAL_CAPSULE | ORAL | 0 refills | Status: DC

## 2018-06-25 MED ORDER — LIDOCAINE HCL 1 % IJ SOLN
5.0000 mL | INTRAMUSCULAR | Status: AC | PRN
  Administered 2018-06-25: 5 mL

## 2018-06-25 NOTE — Addendum Note (Signed)
Addended byPrescott Parma on: 06/25/2018 09:37 AM   Modules accepted: Orders

## 2018-06-25 NOTE — Telephone Encounter (Signed)
I added charge for zilretta, [id number] x 32 units.  I removed E/M, since this was a planned injection. Thank you.

## 2018-06-25 NOTE — Progress Notes (Addendum)
Office Visit Note   Patient: Kristen Maxwell           Date of Birth: Aug 01, 1955           MRN: [id number] Visit Date: 06/25/2018 Requested by: No referring provider defined for this encounter. PCP: System, Pcp Not In  Subjective: Chief Complaint  Patient presents with  . 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  She has known end-stage arthritis in the knee.  She is here for Zilretta injection.  She is also requesting Celebrex and refill of tramadol.  She is having some daily pain and stiffness which is worsened over the past 30 days.  She is only taking tramadol as needed.  She is tried other inflammatories such as ibuprofen Naprosyn Voltaren all without significant relief.  She does manage her reflux with diet and occasional over-the-counter medication.              ROS: All systems reviewed are negative as they relate to the chief complaint within the history of present illness.  Patient denies  fevers or chills.  Assessment & Plan: Visit Diagnoses:  1. Unilateral primary osteoarthritis, right knee     Plan: Impression is right knee arthritis which is progressing but has not really started giving her flexion contracture yet.  Plan is to inject the Zilretta today.  90-day prescription of Celebrex and along with tramadol.  Also order preapproved for Synvisc 1 in the right knee.  Follow-up with me in about 3 months depending on how long this injection lasts.  She is going to consider stem cell injection which has not really made it yet into peer-reviewed literature.  Has [place] $4000 expense for her and she is going to consider that option depending on how she does with this injection.  Follow-Up Instructions: Return if symptoms worsen or fail to improve.   Orders:  No orders of the defined types were placed in this encounter.  No orders of the defined types were placed in this encounter.     Procedures: Large Joint Inj on  06/25/2018 9:32 AM Indications: diagnostic evaluation, joint swelling and pain Details: 18 G 1.5 in needle, superolateral approach  Arthrogram: No  Medications: 5 mL lidocaine 1 % Outcome: tolerated well, no immediate complications Procedure, treatment alternatives, risks and benefits explained, specific risks discussed. Consent was given by the patient. Immediately prior to procedure a time out was called to verify the correct patient, procedure, equipment, support staff and site/side marked as required. Patient was prepped and draped in the usual sterile fashion.       Clinical Data: No additional findings.  Objective: Vital Signs: There were no vitals taken for this visit.  Physical Exam:   Constitutional: Patient appears well-developed HEENT:  Head: Normocephalic Eyes:EOM are normal Neck: Normal range of motion Cardiovascular: Normal rate Pulmonary/chest: Effort normal Neurologic: Patient is alert Skin: Skin is warm Psychiatric: Patient has normal mood and affect      Ortho Exam: Ortho exam demonstrates mild effusion in the right knee.  She does have full extension and flexion with patellofemoral crepitus.  Collateral cruciate ligaments are stable.  She has generally speaking global pain medially and superior laterally.  Specialty Comments:  No specialty comments available.  Imaging: No results found.   PMFS History: Patient Active Problem List   Diagnosis Date Noted  . PERSISTENT DISORDER INITIATING/MAINTAINING SLEEP 02/20/2010   History reviewed. No pertinent past medical history.  History reviewed. No pertinent family history.  History reviewed. No pertinent surgical history. Social History   Occupational History  . Not on file  Tobacco Use  . Smoking status: Never Smoker  . Smokeless tobacco: Never Used  Substance and Sexual Activity  . Alcohol use: Yes    Alcohol/week: 1.0 standard drinks    Types: 1 Glasses of wine per week    Comment: occas  with dinner  . Drug use: No  . Sexual activity: Not on file

## 2018-06-25 NOTE — Telephone Encounter (Signed)
Patient came in for appt this a.m. and injection was done.

## 2018-06-25 NOTE — Telephone Encounter (Signed)
Please get patient approved for right knee synvisc one

## 2018-07-01 NOTE — Telephone Encounter (Signed)
Noted  

## 2018-07-15 ENCOUNTER — Telehealth (INDEPENDENT_AMBULATORY_CARE_PROVIDER_SITE_OTHER): Payer: Self-pay

## 2018-07-15 NOTE — Telephone Encounter (Signed)
Submitted VOB for Durolane, right knee. 

## 2018-07-17 ENCOUNTER — Telehealth (INDEPENDENT_AMBULATORY_CARE_PROVIDER_SITE_OTHER): Payer: Self-pay

## 2018-07-17 NOTE — Telephone Encounter (Signed)
Talked with patient and advised her that she is approved for gel injection. Patient stated that she received an injection (Zilretta) on 06/25/2018 and will call back once this injection wears off to receive another one.  Patient is approved for Durolane, right knee. Buy & Bill Patient will be responsible for 20% OOP. No Co-pay No PA required

## 2018-07-27 ENCOUNTER — Telehealth: Payer: Self-pay

## 2018-07-27 NOTE — Telephone Encounter (Signed)
We could do the synvisc injection pls clal htx

## 2018-07-27 NOTE — Telephone Encounter (Signed)
Patient called stating that the Zilretta injection that she had done in April has already worn off.  She states that it did not last as long as what she usually gets injected with. She said that her insurance changes June 1 and her deductible increases. She wanted to know if it was too soon to get another injection? Please advise. Thanks. [phone]

## 2018-07-27 NOTE — Telephone Encounter (Signed)
Can you please see if we can get her approved for synvisc?  We need it before her insurance deductible increases on June 1. Please submit PA and get her scheduled ASAP. Thanks.

## 2018-07-28 ENCOUNTER — Telehealth: Payer: Self-pay

## 2018-07-28 NOTE — Telephone Encounter (Signed)
Submitted VOB for SynviscOne, right knee. 

## 2018-07-28 NOTE — Telephone Encounter (Signed)
Submitted

## 2018-07-29 ENCOUNTER — Telehealth: Payer: Self-pay

## 2018-07-29 NOTE — Telephone Encounter (Signed)
Talked with patient and advised her that she is approved for gel injection.  Patient is approved for SynviscOne, right knee. Buy & Bill Covered at 80% after deductible Patient will be responsible for 20% OOP. No Co-pay No PA required

## 2018-08-06 ENCOUNTER — Telehealth: Payer: Self-pay

## 2018-08-06 ENCOUNTER — Ambulatory Visit (INDEPENDENT_AMBULATORY_CARE_PROVIDER_SITE_OTHER): Payer: 59 | Admitting: Orthopedic Surgery

## 2018-08-06 ENCOUNTER — Encounter: Payer: Self-pay | Admitting: Orthopedic Surgery

## 2018-08-06 ENCOUNTER — Other Ambulatory Visit: Payer: Self-pay

## 2018-08-06 DIAGNOSIS — M1711 Unilateral primary osteoarthritis, right knee: Secondary | ICD-10-CM | POA: Diagnosis not present

## 2018-08-06 MED ORDER — HYLAN G-F 20 48 MG/6ML IX SOSY
48.0000 mg | PREFILLED_SYRINGE | INTRA_ARTICULAR | Status: AC | PRN
  Administered 2018-08-06: 48 mg via INTRA_ARTICULAR

## 2018-08-06 MED ORDER — LIDOCAINE HCL 1 % IJ SOLN
5.0000 mL | INTRAMUSCULAR | Status: AC | PRN
  Administered 2018-08-06: 5 mL

## 2018-08-06 NOTE — Progress Notes (Addendum)
   Procedure Note PATIENT WILL NEED RIGHT KNEE XRAYS IN 6 WEEKS  Patient: Kristen Maxwell             Date of Birth: May 16, 1955           MRN: [id number]             Visit Date: 08/06/2018  Procedures: Visit Diagnoses: Unilateral primary osteoarthritis, right knee  Large Joint Inj: R knee on 08/06/2018 9:23 AM Indications: pain, joint swelling and diagnostic evaluation Details: 18 G 1.5 in needle, superolateral approach  Arthrogram: No  Medications: 5 mL lidocaine 1 %; 48 mg Hylan 48 MG/6ML Outcome: tolerated well, no immediate complications Procedure, treatment alternatives, risks and benefits explained, specific risks discussed. Consent was given by the patient. Immediately prior to procedure a time out was called to verify the correct patient, procedure, equipment, support staff and site/side marked as required. Patient was prepped and draped in the usual sterile fashion.     Patient is having increasing pain in the right knee.  This is affecting her work.  She would like to try platelet rich plasma injection which we will do in about 6 weeks.  The Zilretta did not help her as much as prior cortisone injections. This patient is diagnosed with osteoarthritis of the knee(s).    Radiographs show evidence of joint space narrowing, osteophytes, subchondral sclerosis and/or subchondral cysts.  This patient has knee pain which interferes with functional and activities of daily living.    This patient has experienced inadequate response, adverse effects and/or intolerance with conservative treatments such as acetaminophen, NSAIDS, topical creams, physical therapy or regular exercise, knee bracing and/or weight loss.   This patient has experienced inadequate response or has a contraindication to intra articular steroid injections for at least 3 months.   This patient is not scheduled to have a total knee replacement within 6 months of starting treatment with viscosupplementation.

## 2018-08-06 NOTE — Telephone Encounter (Signed)
I have reached out to rep to advise.  I will call patient to schedule this.

## 2018-08-06 NOTE — Telephone Encounter (Signed)
Dr August Saucer wants patient to have PRP injection with Arthrex in 6 weeks.

## 2018-08-31 ENCOUNTER — Telehealth: Payer: Self-pay

## 2018-08-31 NOTE — Telephone Encounter (Signed)
I think it is 50-50 at best that she gets relief from the PRP injection and whether or not it is better than the cortisone and gel injections that she has been getting is really up for debate..  I do not think that it would necessarily be a negative other than the cost.  The stem cell definitely is on the more cost prohibitive side for the potential benefit.  PRP is right in the gray zone.

## 2018-08-31 NOTE — Telephone Encounter (Signed)
I will check. Waiting on response from Dr Marlou Sa. Patient now unsure if wants to proceed.  States she has interest in stem-cell treatment.

## 2018-08-31 NOTE — Telephone Encounter (Signed)
Called and spoke with patient about getting the PRP injection done in her knee.  She states that she would like to know if you feel like it would even be beneficial for her to have it done? She said that she had mentioned to you about getting stem cell done in [place].  Please advise before I proceed with scheduling. Patient aware she would have to pay $475 out of pocket for the PRP.  I will wait to return patient call tomorrow 06/09 per her request.

## 2018-08-31 NOTE — Telephone Encounter (Signed)
Per patients request will call her tomorrow.

## 2018-08-31 NOTE — Telephone Encounter (Signed)
Double check with the rep on the kits you have in stock.  Pretty sure the series one is what is needed for this patient.  Thanks!

## 2018-09-01 NOTE — Telephone Encounter (Signed)
IC s/w patient  She will think about it and let us know what she wants to do.

## 2018-09-22 ENCOUNTER — Other Ambulatory Visit: Payer: Self-pay | Admitting: Orthopedic Surgery

## 2018-09-22 NOTE — Telephone Encounter (Signed)
Ok to rf? 

## 2018-09-22 NOTE — Telephone Encounter (Signed)
y

## 2018-09-23 NOTE — Telephone Encounter (Signed)
Ok to rf? 

## 2018-09-29 ENCOUNTER — Other Ambulatory Visit: Payer: Self-pay | Admitting: Orthopedic Surgery

## 2018-09-29 NOTE — Telephone Encounter (Signed)
Ok to rf? 

## 2018-09-29 NOTE — Telephone Encounter (Signed)
y

## 2018-10-20 DIAGNOSIS — N951 Menopausal and female climacteric states: Secondary | ICD-10-CM | POA: Insufficient documentation

## 2018-10-23 ENCOUNTER — Encounter: Payer: Self-pay | Admitting: Family Medicine

## 2018-10-23 ENCOUNTER — Other Ambulatory Visit: Payer: Self-pay

## 2018-10-23 ENCOUNTER — Ambulatory Visit (INDEPENDENT_AMBULATORY_CARE_PROVIDER_SITE_OTHER): Payer: 59 | Admitting: Family Medicine

## 2018-10-23 DIAGNOSIS — M1711 Unilateral primary osteoarthritis, right knee: Secondary | ICD-10-CM

## 2018-10-23 NOTE — Progress Notes (Signed)
   Office Visit Note   Patient: Kristen Maxwell           Date of Birth: [birth year]           MRN: [id number] Visit Date: 10/23/2018 Requested by: No referring provider defined for this encounter. PCP: System, Pcp Not In  Subjective: Chief Complaint  Patient presents with  . Right Knee - Pain    prolotherapy    HPI: She is here with ongoing right knee pain.  She has osteoarthritis.  She is not responding to gel injections.  Cortisone helps, but she is trying to minimize the amount of cortisone that she gets.  She is interested in the possibility of prolotherapy with dextrose.  Pain is primarily on the medial side of her knee.                ROS: No fevers or chills.  All other systems were reviewed and are negative.  Objective: Vital Signs: There were no vitals taken for this visit.  Physical Exam:  General:  Alert and oriented, in no acute distress. Pulm:  Breathing unlabored. Psy:  Normal mood, congruent affect. Skin: No rash or erythema. Right knee: 1+ effusion, no warmth.  Good range of motion, 1+ patellofemoral crepitus.  There is a little tenderness on the medial patellofemoral joint, maximum tenderness is a little bit more distal and medial, medial to the patella tendon and proximal to the Pes bursa area, more in the region of the anterior medial joint line.  Imaging: None today.  Assessment & Plan: 1.  Chronic right knee pain with osteoarthritis -We will inject with dextrose prolotherapy today.  Follow-up in 3 to 4 weeks for second of possibly 3 to 5 injections depending on how she responds.     Procedures: Right knee injection: After sterile prep with Betadine, injected a solution of 6 cc 1% lidocaine without epinephrine and 4 cc 50% dextrose, a total of 8 cc intra-articularly from medial midpatellar approach using ultrasound to guide needle placement into the medial joint recess, then injected another 2 cc into the area of maximum tenderness distal to that.   She had excellent immediate pain relief.    PMFS History: Patient Active Problem List   Diagnosis Date Noted  . PERSISTENT DISORDER INITIATING/MAINTAINING SLEEP 02/20/2010   History reviewed. No pertinent past medical history.  History reviewed. No pertinent family history.  History reviewed. No pertinent surgical history. Social History   Occupational History  . Not on file  Tobacco Use  . Smoking status: Never Smoker  . Smokeless tobacco: Never Used  Substance and Sexual Activity  . Alcohol use: Yes    Alcohol/week: 1.0 standard drinks    Types: 1 Glasses of wine per week    Comment: occas with dinner  . Drug use: No  . Sexual activity: Not on file

## 2018-11-11 ENCOUNTER — Other Ambulatory Visit: Payer: Self-pay | Admitting: Orthopedic Surgery

## 2018-12-14 ENCOUNTER — Encounter: Payer: Self-pay | Admitting: Family Medicine

## 2018-12-14 ENCOUNTER — Ambulatory Visit (INDEPENDENT_AMBULATORY_CARE_PROVIDER_SITE_OTHER): Payer: 59 | Admitting: Family Medicine

## 2018-12-14 DIAGNOSIS — M1711 Unilateral primary osteoarthritis, right knee: Secondary | ICD-10-CM | POA: Diagnosis not present

## 2018-12-14 MED ORDER — TRAMADOL HCL 50 MG PO TABS: 50.0000 mg | ORAL_TABLET | Freq: Two times a day (BID) | ORAL | 0 refills | Status: DC | PRN

## 2018-12-14 NOTE — Progress Notes (Signed)
   Office Visit Note   Patient: Kristen Maxwell           Date of Birth: [birth year]           MRN: [id number] Visit Date: 12/14/2018 Requested by: No referring provider defined for this encounter. PCP: System, Pcp Not In  Subjective: Chief Complaint  Patient presents with  . Right Knee - Pain    Prolotherapy injection worked well up until the last 10 days. Worse pain today, after working a 3-day rotation. Knee stiffens if sits a while.    HPI: She is here for another prolotherapy dextrose injection into the right knee.  She had very good relief for about 4 weeks, then unfortunately her mother died and she went to the funeral wearing high-heeled shoes and her knee has been hurting since then.              ROS:   All other systems were reviewed and are negative.  Objective: Vital Signs: There were no vitals taken for this visit.  Physical Exam:  General:  Alert and oriented, in no acute distress. Pulm:  Breathing unlabored. Psy:  Normal mood, congruent affect. Skin: No erythema. Right knee: 1+ effusion with no warmth.  Tender still on the medial side of her patella fat pad and also patellofemoral joint.  Imaging: None today other than for injection guidance  Assessment & Plan: 1.  Right knee DJD, mainly patellofemoral joint -Prolotherapy #2 today, follow-up in a month for her third injection.     Procedures: Ultrasound-guided right knee injection: After sterile prep with Betadine, injected 8 cc of a 20% dextrose solution in 1% lidocaine into the medial joint recess, then injected 2 cc into the area of maximum tenderness near the medial patella fat pad.    PMFS History: Patient Active Problem List   Diagnosis Date Noted  . Menopausal symptoms 10/20/2018  . Hypercoagulable state ([place]) 08/19/2017  . Iron deficiency anemia due to chronic blood loss 08/19/2017  . Endometrial polyp 06/27/2017  . Vaginal bleeding 06/27/2017  . 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 Vaginal dryness, menopausal 08/08/2011  . PERSISTENT DISORDER INITIATING/MAINTAINING SLEEP 02/20/2010  . Anemia, unspecified 01/12/2007  . Other premature beats 01/12/2007   History reviewed. No pertinent past medical history.  History reviewed. No pertinent family history.  History reviewed. No pertinent surgical history. Social History   Occupational History  . Not on file  Tobacco Use  . Smoking status: Never Smoker  . Smokeless tobacco: Never Used  Substance and Sexual Activity  . Alcohol use: Yes    Alcohol/week: 1.0 standard drinks    Types: 1 Glasses of wine per week    Comment: occas with dinner  . Drug use: No  . Sexual activity: Not on file

## 2019-02-02 ENCOUNTER — Other Ambulatory Visit: Payer: Self-pay | Admitting: Orthopedic Surgery

## 2019-02-02 NOTE — Telephone Encounter (Signed)
Ok to rf? 

## 2019-02-11 ENCOUNTER — Ambulatory Visit (INDEPENDENT_AMBULATORY_CARE_PROVIDER_SITE_OTHER): Payer: 59 | Admitting: Family Medicine

## 2019-02-11 ENCOUNTER — Encounter: Payer: Self-pay | Admitting: Family Medicine

## 2019-02-11 ENCOUNTER — Telehealth: Payer: Self-pay

## 2019-02-11 ENCOUNTER — Other Ambulatory Visit: Payer: Self-pay

## 2019-02-11 DIAGNOSIS — M1711 Unilateral primary osteoarthritis, right knee: Secondary | ICD-10-CM | POA: Diagnosis not present

## 2019-02-11 MED ORDER — TRAMADOL HCL 50 MG PO TABS: 50.0000 mg | ORAL_TABLET | Freq: Two times a day (BID) | ORAL | 0 refills | Status: DC | PRN

## 2019-02-11 NOTE — Telephone Encounter (Signed)
Rx sent 

## 2019-02-11 NOTE — Progress Notes (Signed)
   Office Visit Note   Patient: Kristen Maxwell           Date of Birth: 1955-05-02           MRN: [id number] Visit Date: 02/11/2019 Requested by: No referring provider defined for this encounter. PCP: System, Pcp Not In  Subjective: Chief Complaint  Patient presents with  . Right Knee - Pain    Knee stiffens if sits a while - ok if she moves around. Knee is "a little puffy" this morning. "Continuous pain" in the knee.    HPI: She is here for follow-up right knee osteoarthritis.  She has had 2 dextrose injections with some improvement.  Having a lot of aching pain and stiffness when she sits.  She feels better when she moves.  She has a wedding to attend in April and she would like a cortisone shot prior to that, but is interested in 1 today.              ROS: No fevers or chills.  All other systems were reviewed and are negative.  Objective: Vital Signs: There were no vitals taken for this visit.  Physical Exam:  General:  Alert and oriented, in no acute distress. Pulm:  Breathing unlabored. Psy:  Normal mood, congruent affect. Skin: No erythema. Right knee: 1+ effusion with no warmth.  1+ patellofemoral crepitus.  Tender on the medial joint line.  Good range of motion.  Imaging: None today.  Assessment & Plan: 1.  Right knee osteoarthritis -Steroid injection today.  Follow-up as needed.     Procedures: Right knee steroid injection: After sterile prep with Betadine, injected 5 cc 1% lidocaine without epinephrine and 40 mg methylprednisolone from superolateral approach.    PMFS History: Patient Active Problem List   Diagnosis Date Noted  . Menopausal symptoms 10/20/2018  . Hypercoagulable state ([place]) 08/19/2017  . Iron deficiency anemia due to chronic blood loss 08/19/2017  . Endometrial polyp 06/27/2017  . Vaginal bleeding 06/27/2017  . Central retinal artery occlusion 05/16/2017  . Vaginal dryness, menopausal 08/08/2011  . PERSISTENT DISORDER  INITIATING/MAINTAINING SLEEP 02/20/2010  . Anemia, unspecified 01/12/2007  . Other premature beats 01/12/2007   No past medical history on file.  No family history on file.  No past surgical history on file. Social History   Occupational History  . Not on file  Tobacco Use  . Smoking status: Never Smoker  . Smokeless tobacco: Never Used  Substance and Sexual Activity  . Alcohol use: Yes    Alcohol/week: 1.0 standard drinks    Types: 1 Glasses of wine per week    Comment: occas with dinner  . Drug use: No  . Sexual activity: Not on file

## 2019-02-11 NOTE — Telephone Encounter (Signed)
The patient forgot to ask for a refill on her Tramadol while she was here. [facility] [place].

## 2019-02-11 NOTE — Addendum Note (Signed)
Addended by: Hortencia Pilar on: 02/11/2019 10:12 AM   Modules accepted: Orders

## 2019-03-29 ENCOUNTER — Telehealth: Payer: Self-pay

## 2019-03-29 ENCOUNTER — Other Ambulatory Visit: Payer: Self-pay | Admitting: Surgical

## 2019-03-29 MED ORDER — DICLOFENAC SODIUM 1 % EX GEL: CUTANEOUS | 0 refills | Status: DC

## 2019-03-29 NOTE — Addendum Note (Signed)
Addended byPrescott Parma on: 03/29/2019 04:55 PM   Modules accepted: Orders

## 2019-03-29 NOTE — Telephone Encounter (Signed)
Patient requesting refill on diclofenac sodium 1% gel 500gm Apply 2-4g to affected area 3 times a day as needed [facility] [address] [address] in [place].

## 2019-03-29 NOTE — Telephone Encounter (Signed)
Submitted

## 2019-05-17 ENCOUNTER — Other Ambulatory Visit: Payer: Self-pay | Admitting: Surgical

## 2019-05-27 ENCOUNTER — Ambulatory Visit: Payer: Self-pay

## 2019-05-27 ENCOUNTER — Ambulatory Visit (INDEPENDENT_AMBULATORY_CARE_PROVIDER_SITE_OTHER): Payer: 59 | Admitting: Family Medicine

## 2019-05-27 ENCOUNTER — Encounter: Payer: Self-pay | Admitting: Family Medicine

## 2019-05-27 ENCOUNTER — Other Ambulatory Visit: Payer: Self-pay

## 2019-05-27 DIAGNOSIS — M79644 Pain in right finger(s): Secondary | ICD-10-CM | POA: Diagnosis not present

## 2019-05-27 DIAGNOSIS — M1711 Unilateral primary osteoarthritis, right knee: Secondary | ICD-10-CM

## 2019-05-27 NOTE — Progress Notes (Signed)
Office Visit Note   Patient: Kristen Maxwell           Date of Birth: 09-15-1955           MRN: [id number] Visit Date: 05/27/2019 Requested by: No referring provider defined for this encounter. PCP: System, Pcp Not In  Subjective: Chief Complaint  Patient presents with  . Right Ring Finger - Mass    Painful nodule has "popped up" 1 week ago  . Right Knee - Pain    Requesting another cortisone injection (last one was 02/11/20). The last one worked very well.    HPI: She is here with recurrent right knee pain and with right fourth finger nodule.  Previous right knee injection gave good relief until recently.  It is still not as bad as before but she would like to have another injection.  Cortisone seems to work best for her.  She noticed a nodule on the dorsum of her right fourth finger about a week ago.  It is tender to touch and it hurts to flex her finger.              ROS:   All other systems were reviewed and are negative.  Objective: Vital Signs: There were no vitals taken for this visit.  Physical Exam:  General:  Alert and oriented, in no acute distress. Pulm:  Breathing unlabored. Psy:  Normal mood, congruent affect.  Right fourth finger: She has a tender movable nodule on the dorsum of her PIP joint. Right knee: Trace effusion with no warmth.  Good range of motion.  Pain with patella compression.  Imaging: Limited diagnostic ultrasound of right fourth finger: She has a cystic structure overlying the PIP joint.  It seems to communicate with the joint rather than the extensor tendon.  Assessment & Plan: 1.  Right knee DJD -Steroid injection today.  Follow-up as needed.  2.  Right fourth finger dorsal ganglion cyst -We will attempt aspiration and injection today.  Follow-up as needed.     Procedures: Right knee steroid injection: After sterile prep with Betadine, injected 5 cc 1% lidocaine without epinephrine and 40 mg methylprednisolone from lateral  midpatellar approach, a flash of clear yellow synovial fluid was obtained prior to injection.  Right fourth finger cyst aspiration and injection: After sterile prep with Betadine, injected 1 cc 1% lidocaine without epinephrine then attempted aspiration using 18-gauge needle.  The cyst dissipated during this procedure but no fluid was obtained in the syringe.  I then injected 20 mg methylprednisolone.    PMFS History: Patient Active Problem List   Diagnosis Date Noted  . Menopausal symptoms 10/20/2018  . Hypercoagulable state ([facility]) 08/19/2017  . Iron deficiency anemia due to chronic blood loss 08/19/2017  . Endometrial polyp 06/27/2017  . Vaginal bleeding 06/27/2017  . Central retinal artery occlusion 05/16/2017  . Vaginal dryness, menopausal 08/08/2011  . PERSISTENT DISORDER INITIATING/MAINTAINING SLEEP 02/20/2010  . Anemia, unspecified 01/12/2007  . Other premature beats 01/12/2007   History reviewed. No pertinent past medical history.  History reviewed. No pertinent family history.  History reviewed. No pertinent surgical history. Social History   Occupational History  . Not on file  Tobacco Use  . Smoking status: Never Smoker  . Smokeless tobacco: Never Used  Substance and Sexual Activity  . Alcohol use: Yes    Alcohol/week: 1.0 standard drinks    Types: 1 Glasses of wine per week    Comment: occas with dinner  . Drug use: No  .  Sexual activity: Not on file

## 2019-06-20 ENCOUNTER — Other Ambulatory Visit: Payer: Self-pay | Admitting: Surgical

## 2019-06-21 NOTE — Telephone Encounter (Signed)
Hilts patient.  

## 2019-08-12 ENCOUNTER — Other Ambulatory Visit: Payer: Self-pay

## 2019-08-12 ENCOUNTER — Encounter: Payer: Self-pay | Admitting: Family Medicine

## 2019-08-12 ENCOUNTER — Ambulatory Visit (INDEPENDENT_AMBULATORY_CARE_PROVIDER_SITE_OTHER): Payer: 59 | Admitting: Family Medicine

## 2019-08-12 DIAGNOSIS — M1711 Unilateral primary osteoarthritis, right knee: Secondary | ICD-10-CM

## 2019-08-12 NOTE — Progress Notes (Signed)
   Office Visit Note   Patient: Kristen Maxwell           Date of Birth: [birth year]           MRN: [id number] Visit Date: 08/12/2019 Requested by: No referring provider defined for this encounter. PCP: System, Pcp Not In  Subjective: Chief Complaint  Patient presents with  . Right Knee - Pain    Requesting an injection. Had a cortisone injection 05/27/19. Went to a wedding in April - wore heels and danced. Had to ice the knee the next day.    HPI: She is here with a flareup of right knee pain.  She went to a wedding and wore high heels, she thinks this aggravated her knee.  She would like another cortisone injection.              ROS:   All other systems were reviewed and are negative.  Objective: Vital Signs: There were no vitals taken for this visit.  Physical Exam:  General:  Alert and oriented, in no acute distress. Pulm:  Breathing unlabored. Psy:  Normal mood, congruent affect.  Right knee: Trace effusion with no warmth.  Pain with patella compression.  Imaging: No results found.  Assessment & Plan: 1.  Right knee DJD -Steroid injection, follow-up as needed.     Procedures: Right knee steroid injection: After sterile prep with Betadine, injected 5 cc 1% lidocaine without epinephrine and 40 mg methylprednisolone from lateral midpatellar approach.    PMFS History: Patient Active Problem List   Diagnosis Date Noted  . Menopausal symptoms 10/20/2018  . Hypercoagulable state ([facility]) 08/19/2017  . Iron deficiency anemia due to chronic blood loss 08/19/2017  . Endometrial polyp 06/27/2017  . Vaginal bleeding 06/27/2017  . Central retinal artery occlusion 05/16/2017  . Vaginal dryness, menopausal 08/08/2011  . PERSISTENT DISORDER INITIATING/MAINTAINING SLEEP 02/20/2010  . Anemia, unspecified 01/12/2007  . Other premature beats 01/12/2007   History reviewed. No pertinent past medical history.  History reviewed. No pertinent family history.  History reviewed.  No pertinent surgical history. Social History   Occupational History  . Not on file  Tobacco Use  . Smoking status: Never Smoker  . Smokeless tobacco: Never Used  Substance and Sexual Activity  . Alcohol use: Yes    Alcohol/week: 1.0 standard drinks    Types: 1 Glasses of wine per week    Comment: occas with dinner  . Drug use: No  . Sexual activity: Not on file

## 2019-08-20 ENCOUNTER — Other Ambulatory Visit: Payer: Self-pay | Admitting: Orthopedic Surgery

## 2019-08-20 NOTE — Telephone Encounter (Signed)
Hilts patient.  

## 2019-09-02 ENCOUNTER — Other Ambulatory Visit: Payer: Self-pay

## 2019-09-02 ENCOUNTER — Ambulatory Visit: Payer: Self-pay

## 2019-09-02 ENCOUNTER — Ambulatory Visit (INDEPENDENT_AMBULATORY_CARE_PROVIDER_SITE_OTHER): Payer: No Typology Code available for payment source | Admitting: Family Medicine

## 2019-09-02 ENCOUNTER — Encounter: Payer: Self-pay | Admitting: Family Medicine

## 2019-09-02 DIAGNOSIS — M79642 Pain in left hand: Secondary | ICD-10-CM | POA: Diagnosis not present

## 2019-09-02 NOTE — Progress Notes (Signed)
° °  Office Visit Note   Patient: Kristen Maxwell           Date of Birth: 1955-06-30           MRN: [id number] Visit Date: 09/02/2019 Requested by: No referring provider defined for this encounter. PCP: System, Pcp Not In  Subjective: Chief Complaint  Patient presents with   Left Hand - Cyst    Cyst on the left middle finger, says that they keep popping up, not sure what is causing them.    HPI: She is here with a cyst on her left third finger.  In the past month she has noticed this, on the dorsum of her DIP joint.  It is starting to become painful.  The one she had on her right hand finger has not returned after decompression of it.  She recently started taking "Relief Factor" for arthritis but did not tolerate the turmeric component.  She plans to take that part out and resume taking it.  She already uses Voltaren gel for her hands.               ROS:   All other systems were reviewed and are negative.  Objective: Vital Signs: There were no vitals taken for this visit.  Physical Exam:  General:  Alert and oriented, in no acute distress. Pulm:  Breathing unlabored. Psy:  Normal mood, congruent affect.  Left third finger: She has a small mucous cyst over the DIP joint proximal to the nail.  Good range of motion of the joint itself.  Imaging: US Guided Needle Placement  Result Date: 09/02/2019 Limited diagnostic ultrasound of the left third finger reveals a fluid-filled cyst overlying the DIP joint.  I believe it communicates with the joint.   Assessment & Plan: 1.  Left third finger DIP joint mucous cyst related to osteoarthritis -Discussed with patient and elected to decompress the cyst today.  Follow-up as needed for this.     Procedures: Left third finger injection: After sterile prep with Betadine, injected 1/2 cc 1% lidocaine without epinephrine, then using a 22-gauge needle attempted to aspirate the cyst but no fluid was expressed, but I was able to manually  decompress the cyst.  Then I injected about 10 mg of methylprednisolone into it.    PMFS History: Patient Active Problem List   Diagnosis Date Noted   Menopausal symptoms 10/20/2018   Hypercoagulable state ([facility]) 08/19/2017   Iron deficiency anemia due to chronic blood loss 08/19/2017   Endometrial polyp 06/27/2017   Vaginal bleeding 06/27/2017   Central retinal artery occlusion 05/16/2017   Vaginal dryness, menopausal 08/08/2011   PERSISTENT DISORDER INITIATING/MAINTAINING SLEEP 02/20/2010   Anemia, unspecified 01/12/2007   Other premature beats 01/12/2007   No past medical history on file.  No family history on file.  No past surgical history on file. Social History   Occupational History   Not on file  Tobacco Use   Smoking status: Never Smoker   Smokeless tobacco: Never Used  Substance and Sexual Activity   Alcohol use: Yes    Alcohol/week: 1.0 standard drink    Types: 1 Glasses of wine per week    Comment: occas with dinner   Drug use: No   Sexual activity: Not on file

## 2019-09-06 ENCOUNTER — Other Ambulatory Visit: Payer: Self-pay | Admitting: Family Medicine

## 2019-10-06 ENCOUNTER — Other Ambulatory Visit: Payer: Self-pay | Admitting: Family Medicine

## 2019-10-06 MED ORDER — TRAMADOL HCL 50 MG PO TABS: 50.0000 mg | ORAL_TABLET | Freq: Two times a day (BID) | ORAL | 0 refills | Status: DC | PRN

## 2019-11-08 ENCOUNTER — Encounter: Payer: Self-pay | Admitting: Family Medicine

## 2019-11-08 ENCOUNTER — Ambulatory Visit (INDEPENDENT_AMBULATORY_CARE_PROVIDER_SITE_OTHER): Payer: No Typology Code available for payment source

## 2019-11-08 ENCOUNTER — Ambulatory Visit (INDEPENDENT_AMBULATORY_CARE_PROVIDER_SITE_OTHER): Payer: No Typology Code available for payment source | Admitting: Family Medicine

## 2019-11-08 ENCOUNTER — Other Ambulatory Visit: Payer: Self-pay

## 2019-11-08 DIAGNOSIS — M542 Cervicalgia: Secondary | ICD-10-CM

## 2019-11-08 MED ORDER — MELOXICAM 15 MG PO TABS: 7.5000 mg | ORAL_TABLET | Freq: Every day | ORAL | 6 refills | Status: DC | PRN

## 2019-11-08 NOTE — Progress Notes (Signed)
   Office Visit Note   Patient: Kristen Maxwell           Date of Birth: 06-May-1955           MRN: [id number] Visit Date: 11/08/2019 Requested by: No referring provider defined for this encounter. PCP: System, Pcp Not In  Subjective: Chief Complaint  Patient presents with  . Neck - Pain    HPI: She is here with right-sided neck pain.  Symptoms for the past month or 2, no injury.  She has noticed a lot of grinding in her neck.  She feels it and hears it.  She went to physical therapy multiple visits and her pain improved, but the grinding has persisted and she still gets occasional pain radiating from the right upper cervical spine toward her temple region.  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  She is concerned that this new symptom could be a sign of another pending stroke.  Denies any arm radicular symptoms.  She takes ibuprofen as needed.              ROS:   All other systems were reviewed and are negative.  Objective: Vital Signs: There were no vitals taken for this visit.  Physical Exam:  General:  Alert and oriented, in no acute distress. Pulm:  Breathing unlabored. Psy:  Normal mood, congruent affect  Neck: She has good range of motion.  She has right-sided trigger points near the C3-4 and C4-5 level.  Upper extremity strength and reflexes are normal.  Imaging: XR Cervical Spine 2 or 3 views  Result Date: 11/08/2019 X-ray cervical spine reveal C4-T1 moderate to severe degenerative disc disease.  There is C2-3 and C3-4 facet DJD.  Slightly osteopenic appearance of her bones.  No sign of compression fracture.  No sign of neoplasm.   Assessment & Plan: 1.  Right-sided neck pain, suspect due to upper cervical facet arthropathy.  I do not think she is experiencing signs and symptoms of stroke. -Reassurance, we will proceed with cervical MRI scan.  Depending on the results, could contemplate referral  for facet injection if symptoms worsen.  We will try meloxicam as needed.     Procedures: No procedures performed  No notes on file     PMFS History: Patient Active Problem List   Diagnosis Date Noted  . Menopausal symptoms 10/20/2018  . Hypercoagulable state ([facility]) 08/19/2017  . Iron deficiency anemia due to chronic blood loss 08/19/2017  . Endometrial polyp 06/27/2017  . Vaginal bleeding 06/27/2017  . Central retinal artery occlusion 05/16/2017  . Vaginal dryness, menopausal 08/08/2011  . PERSISTENT DISORDER INITIATING/MAINTAINING SLEEP 02/20/2010  . Anemia, unspecified 01/12/2007  . Other premature beats 01/12/2007   History reviewed. No pertinent past medical history.  History reviewed. No pertinent family history.  History reviewed. No pertinent surgical history. Social History   Occupational History  . Not on file  Tobacco Use  . Smoking status: Never Smoker  . Smokeless tobacco: Never Used  Substance and Sexual Activity  . Alcohol use: Yes    Alcohol/week: 1.0 standard drink    Types: 1 Glasses of wine per week    Comment: occas with dinner  . Drug use: No  . Sexual activity: Not on file

## 2019-11-08 NOTE — Progress Notes (Signed)
Neck grinding and cracking HX of stroke PT hasnt helped

## 2019-11-16 ENCOUNTER — Other Ambulatory Visit: Payer: Self-pay | Admitting: Family Medicine

## 2019-11-24 ENCOUNTER — Ambulatory Visit (INDEPENDENT_AMBULATORY_CARE_PROVIDER_SITE_OTHER): Payer: No Typology Code available for payment source | Admitting: Family Medicine

## 2019-11-24 ENCOUNTER — Encounter: Payer: Self-pay | Admitting: Family Medicine

## 2019-11-24 ENCOUNTER — Other Ambulatory Visit: Payer: Self-pay

## 2019-11-24 DIAGNOSIS — M79644 Pain in right finger(s): Secondary | ICD-10-CM

## 2019-11-24 DIAGNOSIS — M1711 Unilateral primary osteoarthritis, right knee: Secondary | ICD-10-CM

## 2019-11-24 NOTE — Progress Notes (Signed)
   Office Visit Note   Patient: Kristen Maxwell           Date of Birth: [birth year]           MRN: [id number] Visit Date: 11/24/2019 Requested by: No referring provider defined for this encounter. PCP: System, Pcp Not In  Subjective: Chief Complaint  Patient presents with  . Right Knee - Pain    Requesting cortisone injection  - last one was 08/12/19.  . Right Thumb - Pain    Painful nodule on right thumb x 1 week. Had to take a tramadol to sleep.    HPI: She is here with right knee pain.  Injection in May helped until she had to walk up a lot of stairs.  She would like to avoid surgery a while longer and requests another injection.  Also her right thumb is hurting again on the dorsum of the IP joint.  She requests an injection there as well.  MRI of the cervical spine is pending.  Pain is improved, but still having a lot of popping and cracking.              ROS:   All other systems were reviewed and are negative.  Objective: Vital Signs: There were no vitals taken for this visit.  Physical Exam:  General:  Alert and oriented, in no acute distress. Pulm:  Breathing unlabored. Psy:  Normal mood, congruent affect. Skin: No erythema or rash Right thumb: She has a possible mucous cyst on the dorsum of the IP joint which is tender to palpation. Right knee: Trace effusion with no warmth.  2+ patellofemoral crepitus.  Good range of motion.  Imaging: No results found.  Assessment & Plan: 1.  Right knee DJD -Steroid injection today.  Follow-up as needed.  2.  Right thumb IP joint DJD -Injection today.     Procedures: Right knee injection: After sterile prep with Betadine, injected 5 cc 1% lidocaine without epinephrine and 40 mg methylprednisolone from lateral midpatellar approach.  Right thumb injection: After sterile prep with Betadine, injected 1/2 cc 1% lidocaine without epinephrine and 20 mg methylprednisolone into the dorsal aspect IP joint, into the mucous  cyst.    PMFS History: Patient Active Problem List   Diagnosis Date Noted  . Menopausal symptoms 10/20/2018  . Hypercoagulable state ([facility]) 08/19/2017  . Iron deficiency anemia due to chronic blood loss 08/19/2017  . Endometrial polyp 06/27/2017  . Vaginal bleeding 06/27/2017  . Central retinal artery occlusion 05/16/2017  . Vaginal dryness, menopausal 08/08/2011  . PERSISTENT DISORDER INITIATING/MAINTAINING SLEEP 02/20/2010  . Anemia, unspecified 01/12/2007  . Other premature beats 01/12/2007   History reviewed. No pertinent past medical history.  History reviewed. No pertinent family history.  History reviewed. No pertinent surgical history. Social History   Occupational History  . Not on file  Tobacco Use  . Smoking status: Never Smoker  . Smokeless tobacco: Never Used  Substance and Sexual Activity  . Alcohol use: Yes    Alcohol/week: 1.0 standard drink    Types: 1 Glasses of wine per week    Comment: occas with dinner  . Drug use: No  . Sexual activity: Not on file

## 2019-11-25 ENCOUNTER — Ambulatory Visit: Payer: No Typology Code available for payment source | Admitting: Family Medicine

## 2019-11-30 ENCOUNTER — Telehealth: Payer: Self-pay | Admitting: Family Medicine

## 2019-11-30 ENCOUNTER — Ambulatory Visit
Admission: RE | Admit: 2019-11-30 | Discharge: 2019-11-30 | Disposition: A | Discharge status: Home / Self Care | Payer: No Typology Code available for payment source | Source: Ambulatory Visit | Attending: Family Medicine | Admitting: Family Medicine

## 2019-11-30 ENCOUNTER — Other Ambulatory Visit: Payer: Self-pay

## 2019-11-30 DIAGNOSIS — M542 Cervicalgia: Secondary | ICD-10-CM

## 2019-11-30 NOTE — Telephone Encounter (Signed)
Cervical spine MRI scan is notable for degenerative changes throughout the spine, particularly at the C2-3 level where there is some bone marrow edema/swelling.  This appears to be the most irritated level.  This would explain the popping feeling.  There are some mild disc bulges along with bone spurs at several levels but no nerve or spinal cord impingement.  No indication for surgery.  Physical therapy is the preferred treatment.  If pain became worse, then could contemplate referral for facet joint injections.

## 2019-12-03 ENCOUNTER — Ambulatory Visit: Payer: No Typology Code available for payment source | Admitting: Family Medicine

## 2019-12-29 ENCOUNTER — Other Ambulatory Visit: Payer: Self-pay | Admitting: Family Medicine

## 2020-01-25 ENCOUNTER — Other Ambulatory Visit: Payer: Self-pay | Admitting: Family Medicine

## 2020-03-19 ENCOUNTER — Other Ambulatory Visit: Payer: Self-pay | Admitting: Family Medicine

## 2020-04-06 ENCOUNTER — Other Ambulatory Visit: Payer: Self-pay

## 2020-04-06 ENCOUNTER — Ambulatory Visit (INDEPENDENT_AMBULATORY_CARE_PROVIDER_SITE_OTHER): Payer: No Typology Code available for payment source | Admitting: Family Medicine

## 2020-04-06 DIAGNOSIS — M1711 Unilateral primary osteoarthritis, right knee: Secondary | ICD-10-CM | POA: Diagnosis not present

## 2020-04-06 NOTE — Progress Notes (Signed)
   Office Visit Note   Patient: Kristen Maxwell           Date of Birth: 1955/07/31           MRN: [id number] Visit Date: 04/06/2020 Requested by: No referring provider defined for this encounter. PCP: Pcp, No  Subjective: Chief Complaint  Patient presents with  . Right Knee - Pain    Requests cortisone injection. Last one was 11/24/19.    HPI: She is here with recurrent right knee pain.  She is trying to avoid knee replacement for another year due to the recent COVID surge shutting down elective procedures.                ROS:   All other systems were reviewed and are negative.  Objective: Vital Signs: There were no vitals taken for this visit.  Physical Exam:  General:  Alert and oriented, in no acute distress. Pulm:  Breathing unlabored. Psy:  Normal mood, congruent affect.  Right knee: Trace effusion with no warmth or erythema.  Tender on the medial joint line.  Full range of motion.  Imaging: No results found.  Assessment & Plan: 1. right knee DJD -Steroid injection today performed with 3 cc 0.25% bupivacaine and 6 mg betamethasone from lateral midpatellar approach.            PMFS History: Patient Active Problem List   Diagnosis Date Noted  . Menopausal symptoms 10/20/2018  . Hypercoagulable state ([facility]) 08/19/2017  . Iron deficiency anemia due to chronic blood loss 08/19/2017  . Endometrial polyp 06/27/2017  . Vaginal bleeding 06/27/2017  . Central retinal artery occlusion 05/16/2017  . Vaginal dryness, menopausal 08/08/2011  . PERSISTENT DISORDER INITIATING/MAINTAINING SLEEP 02/20/2010  . Anemia, unspecified 01/12/2007  . Other premature beats 01/12/2007   No past medical history on file.  No family history on file.  No past surgical history on file. Social History   Occupational History  . Not on file  Tobacco Use  . Smoking status: Never Smoker  . Smokeless tobacco: Never Used  Substance and Sexual Activity  . Alcohol use: Yes     Alcohol/week: 1.0 standard drink    Types: 1 Glasses of wine per week    Comment: occas with dinner  . Drug use: No  . Sexual activity: Not on file

## 2020-04-18 ENCOUNTER — Other Ambulatory Visit: Payer: Self-pay | Admitting: Family Medicine

## 2020-05-21 ENCOUNTER — Other Ambulatory Visit: Payer: Self-pay | Admitting: Family Medicine

## 2020-08-02 ENCOUNTER — Other Ambulatory Visit: Payer: Self-pay | Admitting: Family Medicine

## 2020-08-10 ENCOUNTER — Ambulatory Visit (INDEPENDENT_AMBULATORY_CARE_PROVIDER_SITE_OTHER): Payer: No Typology Code available for payment source | Admitting: Family Medicine

## 2020-08-10 ENCOUNTER — Other Ambulatory Visit: Payer: Self-pay

## 2020-08-10 ENCOUNTER — Ambulatory Visit: Payer: Self-pay

## 2020-08-10 DIAGNOSIS — M1711 Unilateral primary osteoarthritis, right knee: Secondary | ICD-10-CM | POA: Diagnosis not present

## 2020-08-10 DIAGNOSIS — M79644 Pain in right finger(s): Secondary | ICD-10-CM | POA: Diagnosis not present

## 2020-08-10 NOTE — Progress Notes (Signed)
   Office Visit Note   Patient: Kristen Maxwell           Date of Birth: 1955/04/04           MRN: [id number] Visit Date: 08/10/2020 Requested by: No referring provider defined for this encounter. PCP: Pcp, No  Subjective: Chief Complaint  Patient presents with  . Right Knee - Pain    Requests cortisone injection  . Right Ring Finger - Pain    Requests cortisone injection -- hurts worse than the knee.    HPI: She is here with right knee and right ring finger pain.  Both have been hurting for at least a month, the finger now hurts more than the knee.  Pain at the PIP joint of her finger.              ROS:   All other systems were reviewed and are negative.  Objective: Vital Signs: There were no vitals taken for this visit.  Physical Exam:  General:  Alert and oriented, in no acute distress. Pulm:  Breathing unlabored. Psy:  Normal mood, congruent affect. Right fourth finger: She is nodularity and tenderness around the PIP joint with no erythema or warmth. Right knee: Trace effusion, no warmth or erythema.    Imaging: No results found.  Assessment & Plan: 1.  Right fourth finger PIP DJD -Steroid injection today under ultrasound guidance.  2.  Right knee DJD - Steroid injection today.      Procedures: Right fourth finger injection: After sterile prep with Betadine, injected 1 cc 0.25% bupivacaine without epinephrine and 20 mg Depo-Medrol into the joint recess.  Right knee injection: After sterile prep with Betadine, injected 3 cc 0.25% bupivacaine without epinephrine and 40 mg Depo-Medrol from superolateral approach.       PMFS History: Patient Active Problem List   Diagnosis Date Noted  . Menopausal symptoms 10/20/2018  . Hypercoagulable state ([facility]) 08/19/2017  . Iron deficiency anemia due to chronic blood loss 08/19/2017  . Endometrial polyp 06/27/2017  . Vaginal bleeding 06/27/2017  . Central retinal artery occlusion 05/16/2017  . Vaginal dryness,  menopausal 08/08/2011  . PERSISTENT DISORDER INITIATING/MAINTAINING SLEEP 02/20/2010  . Anemia, unspecified 01/12/2007  . Other premature beats 01/12/2007   No past medical history on file.  No family history on file.  No past surgical history on file. Social History   Occupational History  . Not on file  Tobacco Use  . Smoking status: Never Smoker  . Smokeless tobacco: Never Used  Substance and Sexual Activity  . Alcohol use: Yes    Alcohol/week: 1.0 standard drink    Types: 1 Glasses of wine per week    Comment: occas with dinner  . Drug use: No  . Sexual activity: Not on file

## 2020-09-20 ENCOUNTER — Other Ambulatory Visit: Payer: Self-pay | Admitting: Family Medicine

## 2020-10-02 ENCOUNTER — Other Ambulatory Visit: Payer: Self-pay | Admitting: Family Medicine

## 2020-11-01 ENCOUNTER — Ambulatory Visit (INDEPENDENT_AMBULATORY_CARE_PROVIDER_SITE_OTHER): Payer: No Typology Code available for payment source | Admitting: Family Medicine

## 2020-11-01 ENCOUNTER — Other Ambulatory Visit: Payer: Self-pay | Admitting: Family Medicine

## 2020-11-01 ENCOUNTER — Other Ambulatory Visit: Payer: Self-pay

## 2020-11-01 ENCOUNTER — Encounter: Payer: Self-pay | Admitting: Family Medicine

## 2020-11-01 DIAGNOSIS — M1711 Unilateral primary osteoarthritis, right knee: Secondary | ICD-10-CM | POA: Diagnosis not present

## 2020-11-01 DIAGNOSIS — M79644 Pain in right finger(s): Secondary | ICD-10-CM

## 2020-11-01 NOTE — Progress Notes (Signed)
   Office Visit Note   Patient: Kristen Maxwell           Date of Birth: 1955-09-10           MRN: [id number] Visit Date: 11/01/2020 Requested by: No referring provider defined for this encounter. PCP: Pcp, No  Subjective: Chief Complaint  Patient presents with   Right Knee - Pain    Knee is swollen. Has just finished a shift of being on her feet.    Right Ring Finger - Pain    Increased pain in the finger. Has 2 nodules on at the PIP joint.     HPI: She is here with right knee and right fourth finger pain.  She is planning to proceed with knee replacement this December.  She would like a cortisone injection to give her some relief.  Her right fourth finger at the PIP joint has been very swollen lately and painful.                ROS:   All other systems were reviewed and are negative.  Objective: Vital Signs: There were no vitals taken for this visit.  Physical Exam:  General:  Alert and oriented, in no acute distress. Pulm:  Breathing unlabored. Psy:  Normal mood, congruent affect.  Right hand: Her fourth finger PIP joint has what appears to be a mucous cyst dorsally.  Full range of motion, tender to palpation. Right knee: Trace effusion with no warmth or erythema.    Imaging: No results found.  Assessment & Plan: Right fourth finger PIP joint DJD -We will aspirate it today.  Follow-up as needed.  2.  Right knee DJD - Steroid injection today.  Knee replacement later this year.     Procedures: Right fourth finger aspiration: After sterile prep with Betadine, injected 1/2 cc 1% lidocaine without epinephrine then using an 18-gauge needle was able to express gelatinous fluid from the dorsal cyst.  Right knee injection: After sterile prep with Betadine, injected 3 cc 1% lidocaine without epinephrine and 40 mg Depo-Medrol from lateral midpatellar approach.       PMFS History: Patient Active Problem List   Diagnosis Date Noted   Menopausal symptoms  10/20/2018   Hypercoagulable state (HCC) 08/19/2017   Iron deficiency anemia due to chronic blood loss 08/19/2017   Endometrial polyp 06/27/2017   Vaginal bleeding 06/27/2017   Central retinal artery occlusion 05/16/2017   Vaginal dryness, menopausal 08/08/2011   PERSISTENT DISORDER INITIATING/MAINTAINING SLEEP 02/20/2010   Anemia, unspecified 01/12/2007   Other premature beats 01/12/2007   History reviewed. No pertinent past medical history.  History reviewed. No pertinent family history.  History reviewed. No pertinent surgical history. Social History   Occupational History   Not on file  Tobacco Use   Smoking status: Never   Smokeless tobacco: Never  Substance and Sexual Activity   Alcohol use: Yes    Alcohol/week: 1.0 standard drink    Types: 1 Glasses of wine per week    Comment: occas with dinner   Drug use: No   Sexual activity: Not on file

## 2021-02-12 ENCOUNTER — Ambulatory Visit (INDEPENDENT_AMBULATORY_CARE_PROVIDER_SITE_OTHER): Payer: No Typology Code available for payment source

## 2021-02-12 ENCOUNTER — Other Ambulatory Visit: Payer: Self-pay

## 2021-02-12 ENCOUNTER — Ambulatory Visit (INDEPENDENT_AMBULATORY_CARE_PROVIDER_SITE_OTHER): Payer: No Typology Code available for payment source | Admitting: Orthopedic Surgery

## 2021-02-12 DIAGNOSIS — M25561 Pain in right knee: Secondary | ICD-10-CM | POA: Diagnosis not present

## 2021-02-13 NOTE — Progress Notes (Signed)
Office Visit Note   Patient: Kristen Maxwell           Date of Birth: 11-07-1955           MRN: [id number] Visit Date: 02/12/2021 Requested by: No referring provider defined for this encounter. PCP: Jim Like, MD  Subjective: Chief Complaint  Patient presents with   Right Knee - Pain    HPI: Kristen Maxwell is a 65 year old patient with right knee pain.  She is here to discuss surgery.  States the pain has gradually gotten worse but she is able to generally manage her symptoms.  After 312-hour shifts on the weekends her pain is worse but she is actually retiring in March.  She has a little bit of left-sided posterior superior gluteal pain which may or may not be related to her knee issues.  She also has a history of bilateral lower extremity neuropathy type symptoms.  She is not taking much in the way of pain meds except for an occasional tramadol after a long period of standing on that right leg.  She uses cortisone every 3 to 4 months.  She does have a history of a retinal occlusion and could not take Xarelto because of complications.  Overall however she is able to manage fairly well with the right knee and can do most activities that she wants to do.  Does have a little difficulty going up stairs and down stairs in tandem fashion.  The pain does not wake her from sleep at night.              ROS: All systems reviewed are negative as they relate to the chief complaint within the history of present illness.  Patient denies  fevers or chills.   Assessment & Plan: Visit Diagnoses:  1. Right knee pain, unspecified chronicity     Plan: Impression is right knee pain.  Arthritis is present.  Her deformity is not severe.  Plan at this time is episodic injections.  I think she may improve some after retiring.  She will continue to get her injections with Dr. Prince Rome.  I think she is heading for knee replacement at sometime in the future but for now her leg strength is good her weight is good  and her symptoms are manageable.  Clinically when she worsens I think that would be the time to consider surgical intervention.  Follow-Up Instructions: No follow-ups on file.   Orders:  Orders Placed This Encounter  Procedures   XR Knee 1-2 Views Right   No orders of the defined types were placed in this encounter.     Procedures: No procedures performed   Clinical Data: No additional findings.  Objective: Vital Signs: There were no vitals taken for this visit.  Physical Exam:   Constitutional: Patient appears well-developed HEENT:  Head: Normocephalic Eyes:EOM are normal Neck: Normal range of motion Cardiovascular: Normal rate Pulmonary/chest: Effort normal Neurologic: Patient is alert Skin: Skin is warm Psychiatric: Patient has normal mood and affect   Ortho Exam: Ortho exam demonstrates full active and passive range of motion of both knees.  Trace effusion is present on the right.  No groin pain with internal or external rotation of the right leg.  Specialty Comments:  No specialty comments available.  Imaging: No results found.   PMFS History: Patient Active Problem List   Diagnosis Date Noted   Menopausal symptoms 10/20/2018   Hypercoagulable state (HCC) 08/19/2017   Iron deficiency anemia due to chronic blood  loss 08/19/2017   Endometrial polyp 06/27/2017   Vaginal bleeding 06/27/2017   Central retinal artery occlusion 05/16/2017   Vaginal dryness, menopausal 08/08/2011   PERSISTENT DISORDER INITIATING/MAINTAINING SLEEP 02/20/2010   Anemia, unspecified 01/12/2007   Other premature beats 01/12/2007   No past medical history on file.  No family history on file.  No past surgical history on file. Social History   Occupational History   Not on file  Tobacco Use   Smoking status: Never   Smokeless tobacco: Never  Substance and Sexual Activity   Alcohol use: Yes    Alcohol/week: 1.0 standard drink    Types: 1 Glasses of wine per week     Comment: occas with dinner   Drug use: No   Sexual activity: Not on file

## 2021-02-28 ENCOUNTER — Telehealth: Payer: Self-pay | Admitting: Orthopedic Surgery

## 2021-02-28 NOTE — Telephone Encounter (Signed)
02/12/21 ov note faxed to [facility] [phone]. Ph [phone]

## 2021-06-04 ENCOUNTER — Ambulatory Visit: Payer: No Typology Code available for payment source | Admitting: Orthopedic Surgery

## 2021-07-09 ENCOUNTER — Other Ambulatory Visit: Payer: Self-pay | Admitting: Family Medicine

## 2021-07-09 DIAGNOSIS — M5442 Lumbago with sciatica, left side: Secondary | ICD-10-CM

## 2021-07-20 ENCOUNTER — Ambulatory Visit
Admission: RE | Admit: 2021-07-20 | Discharge: 2021-07-20 | Disposition: A | Discharge status: Home / Self Care | Payer: Medicare Other | Source: Ambulatory Visit | Attending: Family Medicine | Admitting: Family Medicine

## 2021-07-20 DIAGNOSIS — M5442 Lumbago with sciatica, left side: Secondary | ICD-10-CM

## 2021-08-15 ENCOUNTER — Telehealth: Payer: Self-pay | Admitting: Physical Medicine and Rehabilitation

## 2021-08-15 DIAGNOSIS — M5116 Intervertebral disc disorders with radiculopathy, lumbar region: Secondary | ICD-10-CM

## 2021-08-15 DIAGNOSIS — M5416 Radiculopathy, lumbar region: Secondary | ICD-10-CM

## 2021-08-15 NOTE — Telephone Encounter (Signed)
-----   Message from Lavada Mesi, MD sent at 08/14/2021  4:28 PM EDT ----- Regarding: RE: ESI I've faxed a referral.  ----- Message ----- From: Tyrell Antonio, MD Sent: 08/14/2021  11:23 AM EDT To: Lavada Mesi, MD Subject: RE: ESI                                        Probably not a big Deal, I can just put in and put your name.  ----- Message ----- From: Lavada Mesi, MD Sent: 08/14/2021  11:05 AM EDT To: Tyrell Antonio, MD Subject: RE: ESI                                        I can do that. I don't have an [facility] form, but I can wing it.  ----- Message ----- From: Tyrell Antonio, MD Sent: 08/13/2021   8:37 PM EDT To: Lavada Mesi, MD Subject: RE: ESI                                        I do not mind entering in the "referral " in Epic so that we can get the injection approved and scheduled.  It would be nice if you had a fax or something with your referral so that we can place in the chart.  ----- Message ----- From: Lavada Mesi, MD Sent: 08/13/2021   4:52 PM EDT To: Tyrell Antonio, MD Subject: RE: ESI                                        I'm afraid I can't place referrals in Epic anymore for some reason. Sorry!  ----- Message ----- From: Tyrell Antonio, MD Sent: 08/13/2021   4:51 PM EDT To: Lavada Mesi, MD Subject: RE: ESI                                        Did you put in as referral or do you need me to just put in as referral?  ----- Message ----- From: Lavada Mesi, MD Sent: 08/13/2021   4:09 PM EDT To: Tyrell Antonio, MD Subject: ESI                                            Hi Fred,  I'd like to refer Nyara to you for a left L4-5 ESI. She's pretty miserable despite PT and chiropractic.  Thanks! Kathlene November

## 2021-09-11 ENCOUNTER — Ambulatory Visit: Payer: Self-pay

## 2021-09-11 ENCOUNTER — Ambulatory Visit (INDEPENDENT_AMBULATORY_CARE_PROVIDER_SITE_OTHER): Payer: No Typology Code available for payment source | Admitting: Physical Medicine and Rehabilitation

## 2021-09-11 ENCOUNTER — Encounter: Payer: Self-pay | Admitting: Physical Medicine and Rehabilitation

## 2021-09-11 VITALS — BP 135/63 | HR 68

## 2021-09-11 DIAGNOSIS — M5416 Radiculopathy, lumbar region: Secondary | ICD-10-CM

## 2021-09-11 MED ORDER — METHYLPREDNISOLONE ACETATE 80 MG/ML IJ SUSP
80.0000 mg | Freq: Once | INTRAMUSCULAR | Status: AC
  Administered 2021-09-11: 80 mg

## 2021-09-11 NOTE — Patient Instructions (Signed)

## 2021-09-11 NOTE — Progress Notes (Signed)
Pt state lower back pain that travels to her left hip, leg and ankle. Pt state sitting and standing makes the pain worse. Pt state heat and pain meds to help ease her pain. Pt state she has been going to the PT that has helped.  Numeric Pain Rating Scale and Functional Assessment Average Pain 3   In the last MONTH (on 0-10 scale) has pain interfered with the following?  1. General activity like being  able to carry out your everyday physical activities such as walking, climbing stairs, carrying groceries, or moving a chair?  Rating(10)   +Driver, -BT, -Dye Allergies.

## 2021-09-21 NOTE — Procedures (Signed)
Lumbar Epidural Steroid Injection - Interlaminar Approach with Fluoroscopic Guidance  Patient: Kristen Maxwell      Date of Birth: [birth year] MRN: [id number] PCP: Jim Like, MD      Visit Date: 09/11/2021   Universal Protocol:     Consent Given By: the patient  Position: PRONE  Additional Comments: Vital signs were monitored before and after the procedure. Patient was prepped and draped in the usual sterile fashion. The correct patient, procedure, and site was verified.   Injection Procedure Details:   Procedure diagnoses: Lumbar radiculopathy [M54.16]   Meds Administered:  Meds ordered this encounter  Medications   methylPREDNISolone acetate (DEPO-MEDROL) injection 80 mg     Laterality: Left  Location/Site:  L4-5  Needle: 3.5 in., 20 ga. Tuohy  Needle Placement: Paramedian epidural  Findings:   -Comments: Excellent flow of contrast into the epidural space.  Procedure Details: Using a paramedian approach from the side mentioned above, the region overlying the inferior lamina was localized under fluoroscopic visualization and the soft tissues overlying this structure were infiltrated with 4 ml. of 1% Lidocaine without Epinephrine. The Tuohy needle was inserted into the epidural space using a paramedian approach.   The epidural space was localized using loss of resistance along with counter oblique bi-planar fluoroscopic views.  After negative aspirate for air, blood, and CSF, a 2 ml. volume of Isovue-250 was injected into the epidural space and the flow of contrast was observed. Radiographs were obtained for documentation purposes.    The injectate was administered into the level noted above.   Additional Comments:  The patient tolerated the procedure well Dressing: 2 x 2 sterile gauze and Band-Aid    Post-procedure details: Patient was observed during the procedure. Post-procedure instructions were reviewed.  Patient left the clinic in stable  condition.

## 2021-09-21 NOTE — Progress Notes (Signed)
Kristen Maxwell - 66 y.o. female MRN [id number]  Date of birth: Aug 18, 1955  Office Visit Note: Visit Date: 09/11/2021 PCP: Jim Like, MD Referred by: Lavada Mesi, MD  Subjective: Chief Complaint  Patient presents with   Lower Back - Pain   Left Leg - Pain   Left Hip - Pain   Left Ankle - Pain   HPI:  Kristen Maxwell is a 66 y.o. female who comes in today at the request of Dr. Lavada Mesi for planned Left L4-5 Lumbar Interlaminar epidural steroid injection with fluoroscopic guidance.  The patient has failed conservative care including home exercise, medications, time and activity modification.  This injection will be diagnostic and hopefully therapeutic.  Please see requesting physician notes for further details and justification.   ROS Otherwise per HPI.  Assessment & Plan: Visit Diagnoses:    ICD-10-CM   1. Lumbar radiculopathy  M54.16 XR C-ARM NO REPORT    Epidural Steroid injection    methylPREDNISolone acetate (DEPO-MEDROL) injection 80 mg      Plan: No additional findings.   Meds & Orders:  Meds ordered this encounter  Medications   methylPREDNISolone acetate (DEPO-MEDROL) injection 80 mg    Orders Placed This Encounter  Procedures   XR C-ARM NO REPORT   Epidural Steroid injection    Follow-up: Return for visit to requesting provider as needed.   Procedures: No procedures performed  Lumbar Epidural Steroid Injection - Interlaminar Approach with Fluoroscopic Guidance  Patient: Kristen Maxwell      Date of Birth: 1955/03/28 MRN: [id number] PCP: Jim Like, MD      Visit Date: 09/11/2021   Universal Protocol:     Consent Given By: the patient  Position: PRONE  Additional Comments: Vital signs were monitored before and after the procedure. Patient was prepped and draped in the usual sterile fashion. The correct patient, procedure, and site was verified.   Injection Procedure Details:   Procedure diagnoses: Lumbar  radiculopathy [M54.16]   Meds Administered:  Meds ordered this encounter  Medications   methylPREDNISolone acetate (DEPO-MEDROL) injection 80 mg     Laterality: Left  Location/Site:  L4-5  Needle: 3.5 in., 20 ga. Tuohy  Needle Placement: Paramedian epidural  Findings:   -Comments: Excellent flow of contrast into the epidural space.  Procedure Details: Using a paramedian approach from the side mentioned above, the region overlying the inferior lamina was localized under fluoroscopic visualization and the soft tissues overlying this structure were infiltrated with 4 ml. of 1% Lidocaine without Epinephrine. The Tuohy needle was inserted into the epidural space using a paramedian approach.   The epidural space was localized using loss of resistance along with counter oblique bi-planar fluoroscopic views.  After negative aspirate for air, blood, and CSF, a 2 ml. volume of Isovue-250 was injected into the epidural space and the flow of contrast was observed. Radiographs were obtained for documentation purposes.    The injectate was administered into the level noted above.   Additional Comments:  The patient tolerated the procedure well Dressing: 2 x 2 sterile gauze and Band-Aid    Post-procedure details: Patient was observed during the procedure. Post-procedure instructions were reviewed.  Patient left the clinic in stable condition.   Clinical History: MRI LUMBAR SPINE WITHOUT CONTRAST   TECHNIQUE: Multiplanar, multisequence MR imaging of the lumbar spine was performed. No intravenous contrast was administered.   COMPARISON:  CTA CT Chest, Abdomen, and Pelvis 03/15/2019.   FINDINGS: Segmentation:  Normal on the comparison.  Alignment: Stable lumbar lordosis since 2020. Subtle retrolisthesis of L4 on L5. Minimal levoconvex lumbar spine curvature.   Vertebrae: No marrow edema or evidence of acute osseous abnormality. Normal background bone marrow signal. Intact visible  sacrum and SI joints.   Conus medullaris and cauda equina: Conus extends to the T12-L1 level. No lower spinal cord or conus signal abnormality.   Paraspinal and other soft tissues: Stable, negative.   Disc levels:   T11-T12 through L1-L2 are negative for age.   L2-L3: Circumferential disc bulge with mild endplate spurring. Borderline to mild spinal stenosis. No convincing lateral recess or foraminal stenosis.   L3-L4: Circumferential disc bulge eccentric to the left with superimposed small central disc protrusion (series 6, image 26). Mild to moderate facet and ligament flavum hypertrophy with degenerative facet joint fluid. No spinal stenosis. Borderline to mild left lateral recess stenosis (left L4 nerve level) and up to moderate left L3 neural foraminal stenosis.   L4-L5: Mild retrolisthesis. Mildly lobulated midline and left lateral recess caudal disc extrusion, with small disc fragment abutting the descending left L5 nerve roots in the left lateral recess on series 7, image 35. Underlying mild disc bulging, facet and ligament flavum hypertrophy. Moderate to severe left lateral recess stenosis. No significant spinal or foraminal stenosis.   L5-S1: Negative disc aside from left subarticular annular fissure. No stenosis.   IMPRESSION: 1. Symptomatic level favored to be L4-L5, where a lobulated caudal disc extrusion extends into the left lateral recess. Query Left L5 radiculitis.   2. However, L3-L4 disc degeneration is also eccentric to the left, with up to mild left lateral recess and moderate left foraminal stenosis (left L4 and left L3 nerve levels, respectively).   3. Generally mild for age lumbar spine degeneration elsewhere. Mild multifactorial spinal stenosis at L2-L3.     Electronically Signed   By: Odessa Fleming M.D.   On: 07/20/2021 08:23     Objective:  VS:  HT:    WT:   BMI:     BP:135/63  HR:68bpm  TEMP: ( )  RESP:  Physical Exam   Imaging: No  results found.

## 2022-01-09 ENCOUNTER — Ambulatory Visit (INDEPENDENT_AMBULATORY_CARE_PROVIDER_SITE_OTHER): Payer: No Typology Code available for payment source | Admitting: Orthopedic Surgery

## 2022-01-09 ENCOUNTER — Ambulatory Visit: Payer: Medicare Other

## 2022-01-09 ENCOUNTER — Ambulatory Visit (INDEPENDENT_AMBULATORY_CARE_PROVIDER_SITE_OTHER): Payer: No Typology Code available for payment source

## 2022-01-09 DIAGNOSIS — M79642 Pain in left hand: Secondary | ICD-10-CM

## 2022-01-09 DIAGNOSIS — M1812 Unilateral primary osteoarthritis of first carpometacarpal joint, left hand: Secondary | ICD-10-CM | POA: Diagnosis not present

## 2022-01-09 DIAGNOSIS — R102 Pelvic and perineal pain: Secondary | ICD-10-CM

## 2022-01-10 ENCOUNTER — Encounter: Payer: Self-pay | Admitting: Orthopedic Surgery

## 2022-01-10 NOTE — Progress Notes (Signed)
Office Visit Note   Patient: Kristen Maxwell           Date of Birth: 11/28/1955           MRN: [id number] Visit Date: 01/09/2022 Requested by: Jim Like, MD [address] [place],  [place] [address] PCP: Jim Like, MD  Subjective: Chief Complaint  Patient presents with   Left Hand - Pain   Lower Back - Pain    HPI: Kristen Maxwell is a 66 y.o. female who presents to the office reporting multiple orthopedic issues today.  She describes bilateral hip iliac crest pain as well as bilateral knee pain leg pain and lumbar spine pain.  She also reports left hand pain in the [facility] joint region.  She has had an injection in this [facility] joint before which helped.  She would like to consider surgical management for the left hand.  Regarding the hips knees and back she reports constant pain with relatively acute onset of symptoms Easter morning of this year.  Denies any groin pain but she does report initial radicular pain.  Denies any numbness and tingling in the legs.  Reports good and bad days.  She localizes almost all of her symptoms to the iliac crest region and not much in the trochanteric region or lower back region.  She has had chiropractic treatment physical therapy and an injection with Dr. Alvester Morin which did not help very much.  She is now working part-time as opposed to full-time as a Teacher, early years/pre.  Sitting makes her symptoms worse.  The pain is deep and not really present to palpation..                ROS: All systems reviewed are negative as they relate to the chief complaint within the history of present illness.  Patient denies fevers or chills.  Assessment & Plan: Visit Diagnoses:  1. Pain in left hand   2. Pain in pelvis     Plan: Impression is left thumb CMC arthritis.  Ultrasound-guided injection performed today into the [facility] joint.  Would also like to refer her to Dr. Valeria Batman for evaluation and management of surgical treatment for her left CMC  arthritis.  She is fairly adamant that she would prefer that I do any surgery on her hand but for this problem I think Dr. Mina Marble is a much better choice given his experience and the number of times he has done the procedure. Regarding the anterior superior iliac crest pain AP pelvis radiographs unremarkable.  Symptoms are slightly atypical.  MRI pelvis indicated due to the severity and atypical nature of the pain as well as the duration of symptoms.  Yoga and stretching does help her symptoms.  I think it is possible this could be referred pain from her back but I would like to get a clean bill of health on the pelvis prior to really getting all of her symptoms to lumbar spine pathology.  Follow-Up Instructions: No follow-ups on file.   Orders:  Orders Placed This Encounter  Procedures   XR Hand Complete Left   XR Pelvis 1-2 Views   US Guided Needle Placement - No Linked Charges   MR Pelvis w/o contrast   Ambulatory referral to Orthopedic Surgery   No orders of the defined types were placed in this encounter.     Procedures: Small Joint Inj: L thumb CMC on 01/09/2022 10:28 AM Indications: pain Details: 25 G needle, ultrasound-guided radial approach  Spinal  Needle: No  Medications: 3 mL lidocaine 1 %; 0.33 mL bupivacaine 0.25 %; 13.33 mg methylPREDNISolone acetate 40 MG/ML Outcome: tolerated well, no immediate complications Procedure, treatment alternatives, risks and benefits explained, specific risks discussed. Consent was given by the patient. Immediately prior to procedure a time out was called to verify the correct patient, procedure, equipment, support staff and site/side marked as required. Patient was prepped and draped in the usual sterile fashion.       Clinical Data: No additional findings.  Objective: Vital Signs: There were no vitals taken for this visit.  Physical Exam:  Constitutional: Patient appears well-developed HEENT:  Head: Normocephalic Eyes:EOM are  normal Neck: Normal range of motion Cardiovascular: Normal rate Pulmonary/chest: Effort normal Neurologic: Patient is alert Skin: Skin is warm Psychiatric: Patient has normal mood and affect  Ortho Exam: Ortho exam demonstrates no nerve root tension signs.  5 out of 5 ankle dorsiflexion plantarflexion quad hamstring strength.  Pedal pulses palpable.  No groin pain with internal/external rotation of the legs.  She has symmetric hip flexion strength bilaterally.  Normal reflexes.  No real back pain to palpation particularly in the SI joint region.  No trochanteric tenderness is present.  No effusion in either knee.  Range of motion is easily past 90 degrees of flexion.  Overall her muscle tone is excellent.  She has mild pain with forward and lateral bending.  Left hand demonstrates positive grind test but intact EPL FPL interosseous function.  Radial pulse intact.  No paresthesias in the hand region.  Osteophytes present at the [facility] joint on the left-hand side.  Specialty Comments:  MRI LUMBAR SPINE WITHOUT CONTRAST   TECHNIQUE: Multiplanar, multisequence MR imaging of the lumbar spine was performed. No intravenous contrast was administered.   COMPARISON:  CTA CT Chest, Abdomen, and Pelvis 03/15/2019.   FINDINGS: Segmentation:  Normal on the comparison.   Alignment: Stable lumbar lordosis since 2020. Subtle retrolisthesis of L4 on L5. Minimal levoconvex lumbar spine curvature.   Vertebrae: No marrow edema or evidence of acute osseous abnormality. Normal background bone marrow signal. Intact visible sacrum and SI joints.   Conus medullaris and cauda equina: Conus extends to the T12-L1 level. No lower spinal cord or conus signal abnormality.   Paraspinal and other soft tissues: Stable, negative.   Disc levels:   T11-T12 through L1-L2 are negative for age.   L2-L3: Circumferential disc bulge with mild endplate spurring. Borderline to mild spinal stenosis. No convincing lateral  recess or foraminal stenosis.   L3-L4: Circumferential disc bulge eccentric to the left with superimposed small central disc protrusion (series 6, image 26). Mild to moderate facet and ligament flavum hypertrophy with degenerative facet joint fluid. No spinal stenosis. Borderline to mild left lateral recess stenosis (left L4 nerve level) and up to moderate left L3 neural foraminal stenosis.   L4-L5: Mild retrolisthesis. Mildly lobulated midline and left lateral recess caudal disc extrusion, with small disc fragment abutting the descending left L5 nerve roots in the left lateral recess on series 7, image 35. Underlying mild disc bulging, facet and ligament flavum hypertrophy. Moderate to severe left lateral recess stenosis. No significant spinal or foraminal stenosis.   L5-S1: Negative disc aside from left subarticular annular fissure. No stenosis.   IMPRESSION: 1. Symptomatic level favored to be L4-L5, where a lobulated caudal disc extrusion extends into the left lateral recess. Query Left L5 radiculitis.   2. However, L3-L4 disc degeneration is also eccentric to the left, with up to mild  left lateral recess and moderate left foraminal stenosis (left L4 and left L3 nerve levels, respectively).   3. Generally mild for age lumbar spine degeneration elsewhere. Mild multifactorial spinal stenosis at L2-L3.     Electronically Signed   By: Odessa Fleming M.D.   On: 07/20/2021 08:23  Imaging: No results found.   PMFS History: Patient Active Problem List   Diagnosis Date Noted   Menopausal symptoms 10/20/2018   Hypercoagulable state (HCC) 08/19/2017   Iron deficiency anemia due to chronic blood loss 08/19/2017   Endometrial polyp 06/27/2017   Vaginal bleeding 06/27/2017   Central retinal artery occlusion 05/16/2017   Vaginal dryness, menopausal 08/08/2011   PERSISTENT DISORDER INITIATING/MAINTAINING SLEEP 02/20/2010   Anemia, unspecified 01/12/2007   Other premature beats  01/12/2007   No past medical history on file.  No family history on file.  No past surgical history on file. Social History   Occupational History   Not on file  Tobacco Use   Smoking status: Never   Smokeless tobacco: Never  Substance and Sexual Activity   Alcohol use: Yes    Alcohol/week: 1.0 standard drink of alcohol    Types: 1 Glasses of wine per week    Comment: occas with dinner   Drug use: No   Sexual activity: Not on file

## 2022-01-13 MED ORDER — METHYLPREDNISOLONE ACETATE 40 MG/ML IJ SUSP
13.3300 mg | INTRAMUSCULAR | Status: AC | PRN
  Administered 2022-01-09: 13.33 mg via INTRA_ARTICULAR

## 2022-01-13 MED ORDER — LIDOCAINE HCL 1 % IJ SOLN
3.0000 mL | INTRAMUSCULAR | Status: AC | PRN
  Administered 2022-01-09: 3 mL

## 2022-01-13 MED ORDER — BUPIVACAINE HCL 0.25 % IJ SOLN
0.3300 mL | INTRAMUSCULAR | Status: AC | PRN
  Administered 2022-01-09: .33 mL via INTRA_ARTICULAR

## 2022-01-24 ENCOUNTER — Ambulatory Visit
Admission: RE | Admit: 2022-01-24 | Discharge: 2022-01-24 | Disposition: A | Discharge status: Home / Self Care | Payer: Medicare Other | Source: Ambulatory Visit | Attending: Orthopedic Surgery | Admitting: Orthopedic Surgery

## 2022-01-24 DIAGNOSIS — R102 Pelvic and perineal pain: Secondary | ICD-10-CM

## 2022-01-28 ENCOUNTER — Encounter: Payer: Self-pay | Admitting: Family Medicine

## 2022-01-29 ENCOUNTER — Other Ambulatory Visit: Payer: Self-pay | Admitting: Family Medicine

## 2022-01-29 DIAGNOSIS — N83209 Unspecified ovarian cyst, unspecified side: Secondary | ICD-10-CM

## 2022-01-29 NOTE — Progress Notes (Signed)
I called pls cx next appt iliac crest no problems

## 2022-02-05 ENCOUNTER — Ambulatory Visit
Admission: RE | Admit: 2022-02-05 | Discharge: 2022-02-05 | Disposition: A | Discharge status: Home / Self Care | Payer: Medicare Other | Source: Ambulatory Visit | Attending: Family Medicine | Admitting: Family Medicine

## 2022-02-05 DIAGNOSIS — N83209 Unspecified ovarian cyst, unspecified side: Secondary | ICD-10-CM

## 2022-02-12 ENCOUNTER — Other Ambulatory Visit: Payer: Self-pay | Admitting: Family Medicine

## 2022-02-12 DIAGNOSIS — R935 Abnormal findings on diagnostic imaging of other abdominal regions, including retroperitoneum: Secondary | ICD-10-CM

## 2022-02-13 ENCOUNTER — Ambulatory Visit: Payer: Medicare Other | Admitting: Orthopedic Surgery

## 2022-03-26 ENCOUNTER — Ambulatory Visit
Admission: RE | Admit: 2022-03-26 | Discharge: 2022-03-26 | Disposition: A | Discharge status: Home / Self Care | Payer: No Typology Code available for payment source | Source: Ambulatory Visit | Attending: Family Medicine | Admitting: Family Medicine

## 2022-03-26 DIAGNOSIS — R935 Abnormal findings on diagnostic imaging of other abdominal regions, including retroperitoneum: Secondary | ICD-10-CM

## 2022-07-23 ENCOUNTER — Ambulatory Visit
Payer: No Typology Code available for payment source | Attending: Cardiovascular Disease | Admitting: Cardiovascular Disease

## 2022-07-23 ENCOUNTER — Encounter: Payer: Self-pay | Admitting: Cardiovascular Disease

## 2022-07-23 VITALS — BP 130/68 | HR 76 | Ht 65.0 in | Wt 140.0 lb

## 2022-07-23 DIAGNOSIS — E782 Mixed hyperlipidemia: Secondary | ICD-10-CM | POA: Diagnosis not present

## 2022-07-23 DIAGNOSIS — E785 Hyperlipidemia, unspecified: Secondary | ICD-10-CM | POA: Insufficient documentation

## 2022-07-23 DIAGNOSIS — H3412 Central retinal artery occlusion, left eye: Secondary | ICD-10-CM | POA: Insufficient documentation

## 2022-07-23 NOTE — Assessment & Plan Note (Signed)
This occurred in 2019.  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  She has had extensive workup including carotid Dopplers, head CT/MRI 30-day event monitor.  All of which have been unrevealing.  She is also seeing a hematologist for workup of a "hypercoagulable state".  She is on a baby aspirin.  I am going to get a 2D echo.

## 2022-07-23 NOTE — Progress Notes (Signed)
07/23/2022 Kristen Maxwell   08-18-1955  [id number]  Primary Physician Jim Like, MD Primary Cardiologist: Runell Gess MD Milagros Loll, [place], [place]  HPI:  Kristen Maxwell is a 67 y.o. thin and fit appearing married Caucasian female mother of 1 child's husband Kristen Maxwell is also a patient of mine.  She works as a Teacher, early years/pre at [employer].  She was referred by her PCP, Dr. Prince Rome, to be established because of risk factors.  She does have history of hyperlipidemia on statin therapy.  There is no family history for heart disease.  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  She had an extensive workup for this including neurologic and hematologic.  She did wear a 30-day event monitor which was uneventful and has been evaluated for "a hypercoagulable state".  She has had extensive neuro workup as well.  She is on a baby aspirin has had no further symptoms.  She is active and walks as well as does yoga.   Current Meds  Medication Sig   ALPRAZolam (XANAX) 1 MG tablet    aspirin 81 MG EC tablet Take by mouth.   atorvastatin (LIPITOR) 20 MG tablet    Calcium Carb-Cholecalciferol (OYSTER SHELL CALCIUM W/D) 500-5 MG-MCG TABS Take by mouth.   celecoxib (CELEBREX) 200 MG capsule Take 200 mg by mouth daily.   cetirizine (ZYRTEC) 10 MG tablet Take 10 mg by mouth daily.   Cholecalciferol 25 MCG (1000 UT) tablet Take by mouth.   diclofenac sodium (VOLTAREN) 1 % GEL APPLY 2-4G TO AFFECTED AREA 3 TIMES A DAY AS NEEDED   fluticasone (FLONASE) 50 MCG/ACT nasal spray    ibuprofen (ADVIL) 800 MG tablet TAKE 1 TABLET 3 TIMES A DAY WITH FOOD AS NEEDED   OVER THE COUNTER MEDICATION daily.   sertraline (ZOLOFT) 100 MG tablet Take 100 mg by mouth daily.   traMADol (ULTRAM) 50 MG tablet TAKE 1 TABLET BY MOUTH TWICE A DAY AS NEEDED   XIIDRA 5 % SOLN APPLY 1 DROP IN BOTH EYES TWICE DAILY     Allergies  Allergen  Reactions   Codeine Nausea Only    Social History   Socioeconomic History   Marital status: Married    Spouse name: Not on file   Number of children: Not on file   Years of education: Not on file   Highest education level: Not on file  Occupational History   Not on file  Tobacco Use   Smoking status: Never   Smokeless tobacco: Never  Substance and Sexual Activity   Alcohol use: Yes    Alcohol/week: 1.0 standard drink of alcohol    Types: 1 Glasses of wine per week    Comment: occas with dinner   Drug use: No   Sexual activity: Not on file  Other Topics Concern   Not on file  Social History Narrative   Not on file   Social Determinants of Health   Financial Resource Strain: Not on file  Food Insecurity: Not on file  Transportation Needs: Not on file  Physical Activity: Not on file  Stress: Not on file  Social Connections: Not on file  Intimate Partner Violence: Not on file     Review of Systems: General: negative for chills, fever, night sweats or weight changes.  Cardiovascular: negative for chest pain, dyspnea on exertion, edema, orthopnea, palpitations, paroxysmal nocturnal  dyspnea or shortness of breath Dermatological: negative for rash Respiratory: negative for cough or wheezing Urologic: negative for hematuria Abdominal: negative for nausea, vomiting, diarrhea, bright red blood per rectum, melena, or hematemesis Neurologic: negative for visual changes, syncope, or dizziness All other systems reviewed and are otherwise negative except as noted above.    Blood pressure 130/68, pulse 76, height 5\' 5"  (1.651 m), weight 140 lb (63.5 kg), SpO2 97 %.  General appearance: alert and no distress Neck: no adenopathy, no carotid bruit, no JVD, supple, symmetrical, trachea midline, and thyroid not enlarged, symmetric, no tenderness/mass/nodules Lungs: clear to auscultation bilaterally Heart: regular rate and rhythm, S1, S2 normal, no murmur, click, rub or  gallop Extremities: extremities normal, atraumatic, no cyanosis or edema Pulses: 2+ and symmetric Skin: Skin color, texture, turgor normal. No rashes or lesions Neurologic: Grossly normal  EKG sinus rhythm at 76 without ST or T wave changes.  Personally reviewed this EKG.  ASSESSMENT AND PLAN:   Central retinal artery occlusion This occurred in 2019.  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  She has had extensive workup including carotid Dopplers, head CT/MRI 30-day event monitor.  All of which have been unrevealing.  She is also seeing a hematologist for workup of a "hypercoagulable state".  She is on a baby aspirin.  I am going to get a 2D echo.  Hyperlipidemia History of hyperlipidemia on statin therapy followed by her PCP.     Runell Gess MD FACP,FACC,FAHA, [facility] 07/23/2022 10:11 AM

## 2022-07-23 NOTE — Patient Instructions (Signed)
Medication Instructions:  Your physician recommends that you continue on your current medications as directed. Please refer to the Current Medication list given to you today.  *If you need a refill on your cardiac medications before your next appointment, please call your pharmacy*   Testing/Procedures: Your physician has requested that you have an echocardiogram. Echocardiography is a painless test that uses sound waves to create images of your heart. It provides your doctor with information about the size and shape of your heart and how well your heart's chambers and valves are working. This procedure takes approximately one hour. There are no restrictions for this procedure. Please do NOT wear cologne, perfume, aftershave, or lotions (deodorant is allowed). Please arrive 15 minutes prior to your appointment time. This procedure will take place at [address]    Follow-Up: At [facility], you and your health needs are our priority.  As part of our continuing mission to provide you with exceptional heart care, we have created designated Provider Care Teams.  These Care Teams include your primary Cardiologist (physician) and Advanced Practice Providers (APPs -  Physician Assistants and Nurse Practitioners) who all work together to provide you with the care you need, when you need it.  We recommend signing up for the patient portal called "MyChart".  Sign up information is provided on this After Visit Summary.  MyChart is used to connect with patients for Virtual Visits (Telemedicine).  Patients are able to view lab/test results, encounter notes, upcoming appointments, etc.  Non-urgent messages can be sent to your provider as well.   To learn more about what you can do with MyChart, go to [email or web address].    Your next appointment:   12 month(s)  Provider:   Jonathan Berry, MD    

## 2022-07-23 NOTE — Assessment & Plan Note (Signed)
History of hyperlipidemia on statin therapy followed by her PCP. 

## 2022-08-27 ENCOUNTER — Ambulatory Visit (HOSPITAL_COMMUNITY): Payer: Medicare Other | Attending: Cardiovascular Disease

## 2022-08-27 DIAGNOSIS — H3412 Central retinal artery occlusion, left eye: Secondary | ICD-10-CM | POA: Insufficient documentation

## 2022-08-27 DIAGNOSIS — E782 Mixed hyperlipidemia: Secondary | ICD-10-CM | POA: Diagnosis present

## 2022-08-27 LAB — ECHOCARDIOGRAM COMPLETE
Area-P 1/2: 2.85 cm2
S' Lateral: 2.2 cm

## 2023-02-06 IMAGING — MR MR LUMBAR SPINE W/O CM
4 of 5 series · 26 of 48 positions shown · non-contrast
Comparison: CTA CT Chest, Abdomen, and Pelvis 03/15/2019.

CLINICAL DATA: 65-year-old female with pain radiating to the left
hip and buttocks with numbness for 30 days. No known injury.

EXAM:
MRI LUMBAR SPINE WITHOUT CONTRAST
TECHNIQUE: Multiplanar, multisequence MR imaging of the lumbar spine was
performed. No intravenous contrast was administered.

[Series 3: T2 · sagittal · 4.0mm · 1.09mm/px · 6 of 15 slices shown (1 of 2)]
[im 1/15]
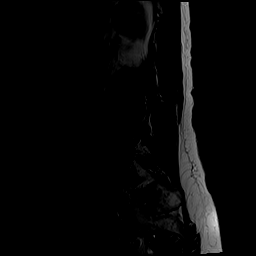
[im 3/15]
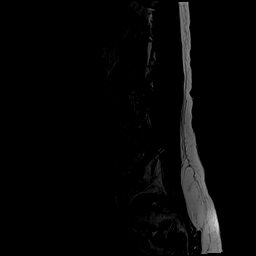
[im 6/15]
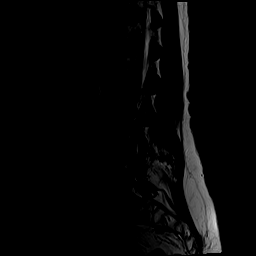
[im 9/15]
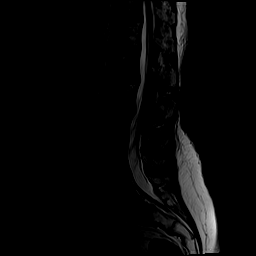
[im 12/15]
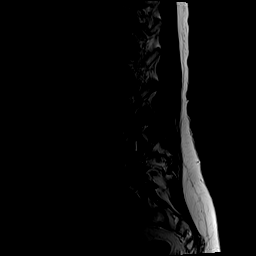
[im 15/15]
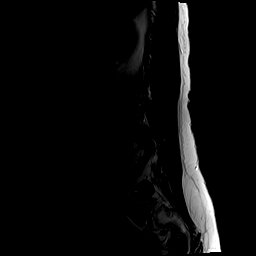

[Series 4: T1 · sagittal · 4.0mm · 1.09mm/px · 5 of 15 slices shown (1 of 2)]
[im 1/15]
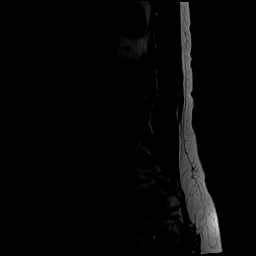
[im 4/15]
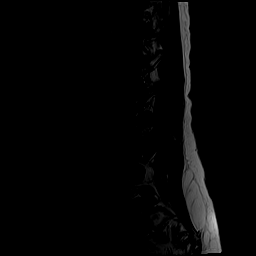
[im 8/15]
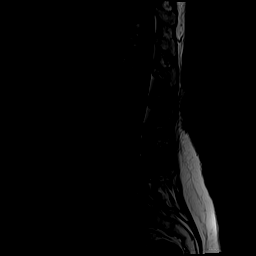
[im 11/15]
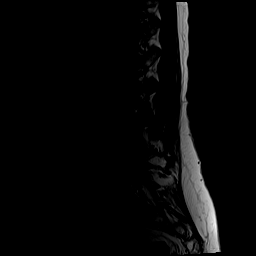
[im 15/15]
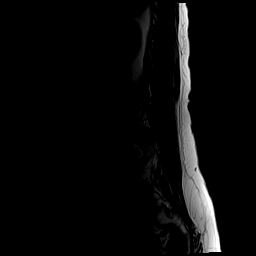

[Series 6: T2 · axial · 4.0mm · 0.39mm/px · z∈[+5,+225]mm · 10 of 43 slices shown (2 of 2)]
[im 3/43]
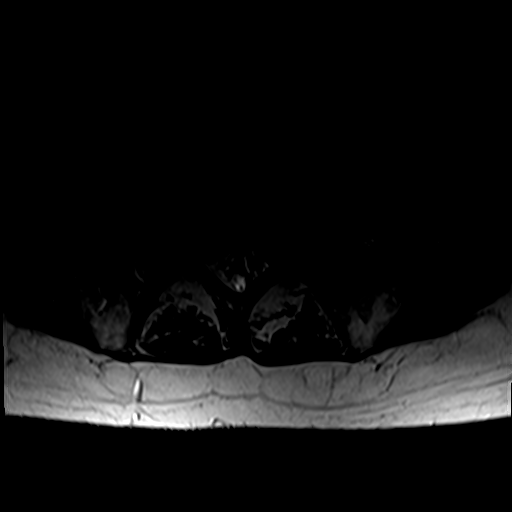
[im 6/43]
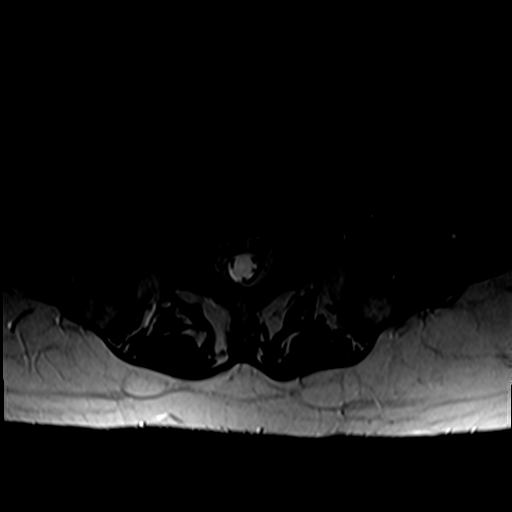
[im 9/43]
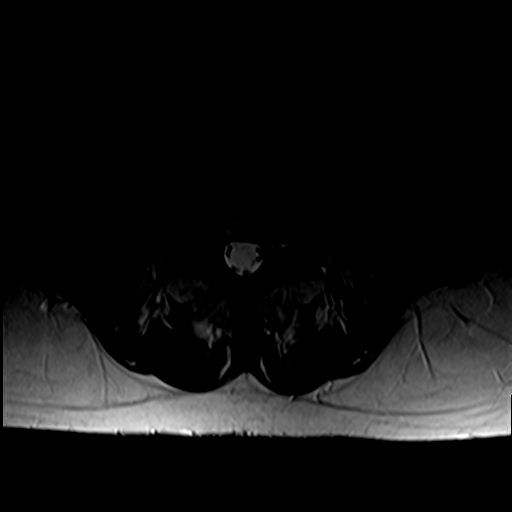
[im 15/43]
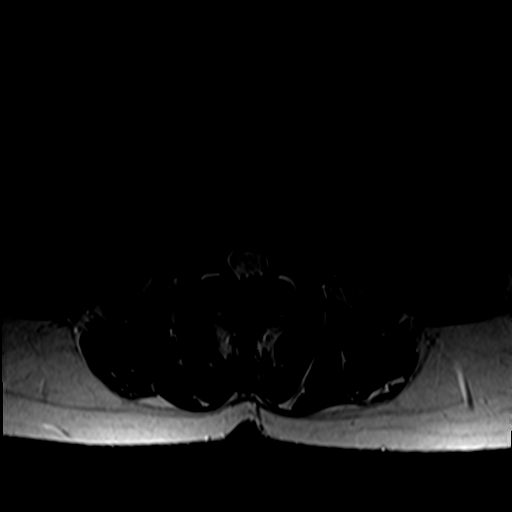
[im 20/43]
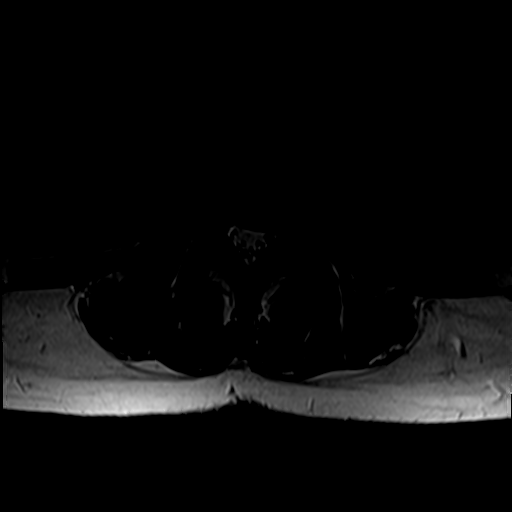
[im 23/43]
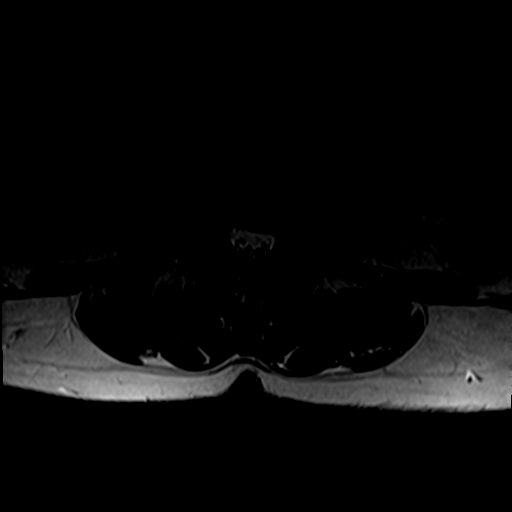
[im 26/43]
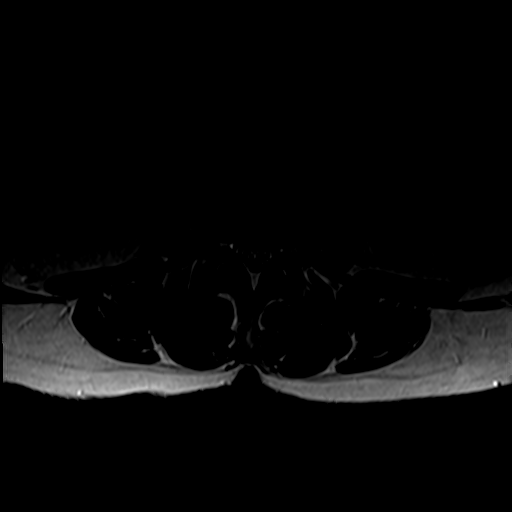
[im 31/43]
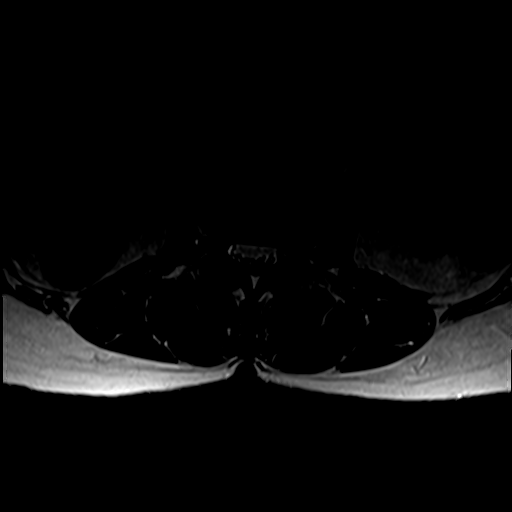
[im 37/43]
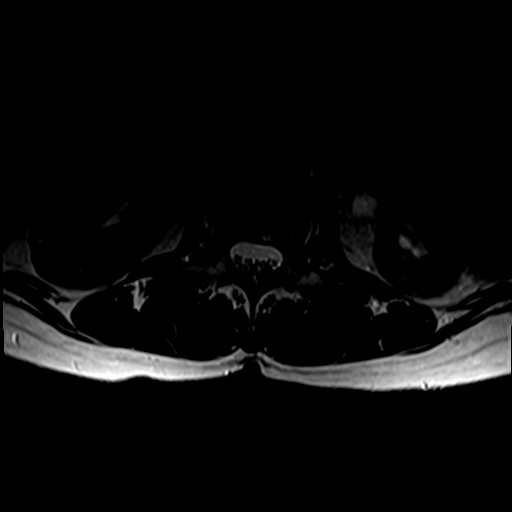
[im 43/43]
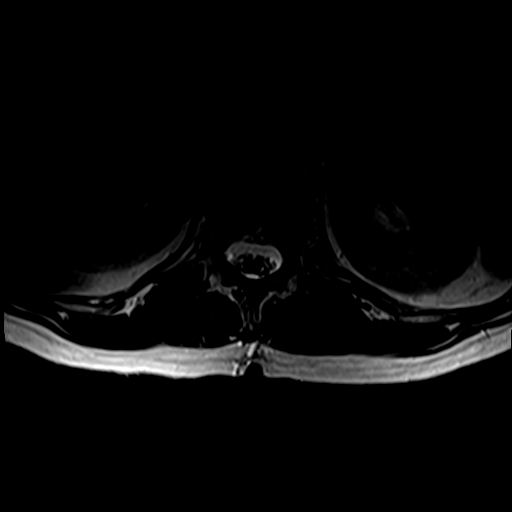

[Series 7: T1 · axial · 4.0mm · 0.39mm/px · z∈[+5,+197]mm · 5 of 43 slices shown (2 of 2)]
[im 3/43]
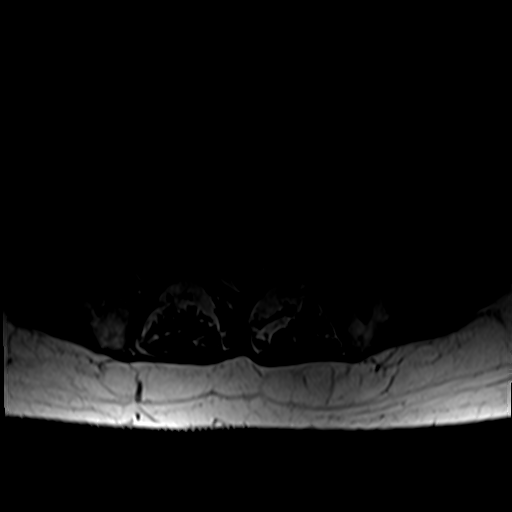
[im 6/43]
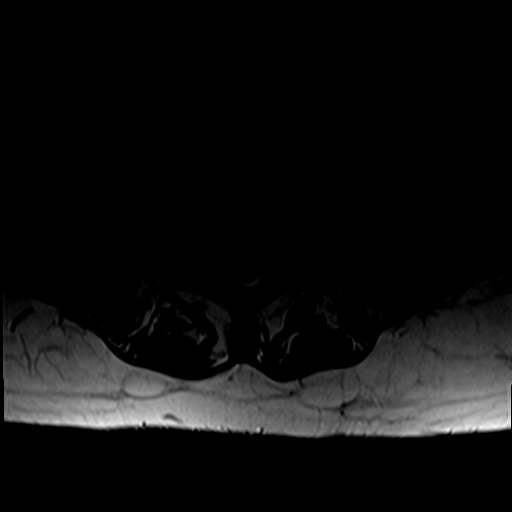
[im 9/43]
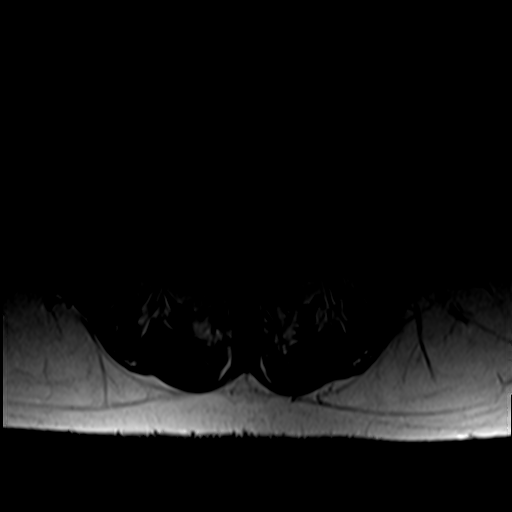
[im 23/43]
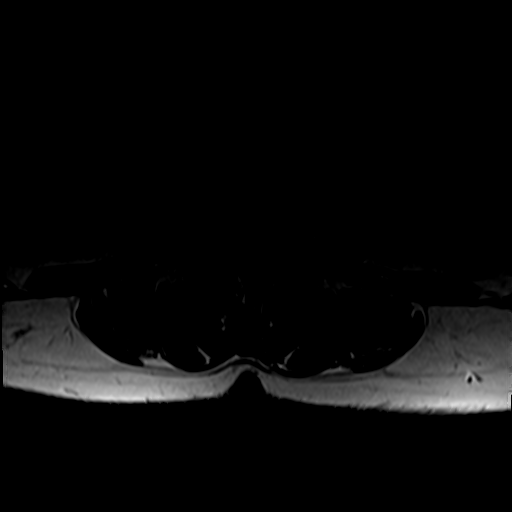
[im 37/43]
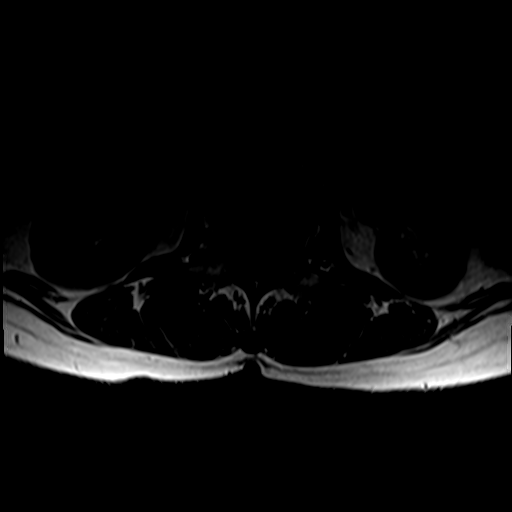

[26 of 48 positions shown; findings below may reference images not displayed]

FINDINGS: Segmentation:  Normal on the comparison.

Alignment: Stable lumbar lordosis since 0303. Subtle retrolisthesis
of L4 on L5. Minimal levoconvex lumbar spine curvature.

Vertebrae: No marrow edema or evidence of acute osseous abnormality.
Normal background bone marrow signal. Intact visible sacrum and SI
joints.

Conus medullaris and cauda equina: Conus extends to the T12-L1
level. No lower spinal cord or conus signal abnormality.

Paraspinal and other soft tissues: Stable, negative.

Disc levels:

T11-T12 through L1-L2 are negative for age.

L2-L3: Circumferential disc bulge with mild endplate spurring.
Borderline to mild spinal stenosis. No convincing lateral recess or
foraminal stenosis.

L3-L4: Circumferential disc bulge eccentric to the left with
superimposed small central disc protrusion (series 6, image 26).
Mild to moderate facet and ligament flavum hypertrophy with
degenerative facet joint fluid. No spinal stenosis. Borderline to
mild left lateral recess stenosis (left L4 nerve level) and up to
moderate left L3 neural foraminal stenosis.

L4-L5: Mild retrolisthesis. Mildly lobulated midline and left
lateral recess caudal disc extrusion, with small disc fragment
abutting the descending left L5 nerve roots in the left lateral
recess on series 7, image 35. Underlying mild disc bulging, facet
and ligament flavum hypertrophy. Moderate to severe left lateral
recess stenosis. No significant spinal or foraminal stenosis.

L5-S1: Negative disc aside from left subarticular annular fissure.
No stenosis.
IMPRESSION: 1. Symptomatic level favored to be L4-L5, where a lobulated caudal
disc extrusion extends into the left lateral recess. Query Left L5
radiculitis.

2. However, L3-L4 disc degeneration is also eccentric to the left,
with up to mild left lateral recess and moderate left foraminal
stenosis (left L4 and left L3 nerve levels, respectively).

3. Generally mild for age lumbar spine degeneration elsewhere. Mild
multifactorial spinal stenosis at L2-L3.

## 2023-07-31 ENCOUNTER — Telehealth: Payer: Self-pay | Admitting: Physical Medicine and Rehabilitation

## 2023-07-31 NOTE — Telephone Encounter (Signed)
 Pt request an appointment with Dr Daisey Dryer, states not sure if she need an injection but would like to see him for her back

## 2023-08-15 ENCOUNTER — Encounter: Payer: Self-pay | Admitting: Physical Medicine and Rehabilitation

## 2023-08-15 ENCOUNTER — Encounter

## 2023-08-15 ENCOUNTER — Ambulatory Visit (INDEPENDENT_AMBULATORY_CARE_PROVIDER_SITE_OTHER): Admitting: Physical Medicine and Rehabilitation

## 2023-08-15 DIAGNOSIS — G8929 Other chronic pain: Secondary | ICD-10-CM | POA: Diagnosis not present

## 2023-08-15 DIAGNOSIS — M47816 Spondylosis without myelopathy or radiculopathy, lumbar region: Secondary | ICD-10-CM | POA: Diagnosis not present

## 2023-08-15 DIAGNOSIS — M5116 Intervertebral disc disorders with radiculopathy, lumbar region: Secondary | ICD-10-CM | POA: Diagnosis not present

## 2023-08-15 DIAGNOSIS — M5442 Lumbago with sciatica, left side: Secondary | ICD-10-CM

## 2023-08-15 DIAGNOSIS — M5416 Radiculopathy, lumbar region: Secondary | ICD-10-CM

## 2023-08-15 NOTE — Progress Notes (Unsigned)
 Kristen Maxwell - 68 y.o. female MRN [id number]  Date of birth: 12/25/1955  Office Visit Note: Visit Date: 08/15/2023 PCP: Fern Hover, MD Referred by: Fern Hover, MD  Subjective: Chief Complaint  Patient presents with   Lower Back - Pain   HPI: Kristen Maxwell is a 68 y.o. female who comes in today for evaluation of acute on chronic left buttock pain, intermittent radiation of pain down left posteolateral leg. Left buttock region is most painful today. She was last seen in our office in 2023. Her pain returned at the beginning of May. Her pain worsens with prolonged sitting. States she was on vacation the beginning of May where she sat in beach chair for long periods, feels this caused her pain to flare up. Also reports pain with prolonged standing at work. She describes her pain as tight and sore sensation, currently rates as 3 out of 10. Some relief of pain with home exercise regimen, rest and use of medications. She does take Tramadol  as needed that is prescribed by Dr. Rey Catholic. She is currently undergoing chiropractic treatments, has deep tissue massage once a month, also performs YOGA daily. Lumbar MRI imaging from 2023 shows mild retrolisthesis and lobulated caudal disc extrusion extending into the left lateral recess at L4-L5, question left L5 radiculitis. There is mild to moderate facet and ligament flavum hypertrophy with degenerative facet joint fluid at L3-L4. No high grade spinal canal stenosis. She underwent left L4-L5 interlaminar epidural steroid injection on 09/11/2021, she reports greater than 50% relief of pain with this injection for over a year. She is currently working full time as Teacher, early years/pre. Patient denies focal weakness, numbness and tingling. No recent trauma or falls.        Review of Systems  Musculoskeletal:  Positive for back pain.  Neurological:  Negative for tingling, sensory change, focal weakness and weakness.  All other systems  reviewed and are negative.  Otherwise per HPI.  Assessment & Plan: Visit Diagnoses:    ICD-10-CM   1. Chronic left-sided low back pain with left-sided sciatica  M54.42    G89.29     2. Lumbar radiculopathy  M54.16     3. Intervertebral disc disorders with radiculopathy, lumbar region  M51.16     4. Facet arthropathy, lumbar  M47.816        Plan: Findings:  Acute on chronic left buttock pain, intermittent pain radiating down left posterolateral leg. Her pain has significantly improved since the beginning of May with conservative therapies. She continues with chiropractic treatments, home exercise regimen, YOGA and medications. Patients clinical presentation and exam are consistent with L5 nerve pattern. We discussed treatment plan in detail today. I think we can hold on injection at this time as her pain has improved over the last several weeks. We did discuss obtaining new lumbar MRI imaging to look closer at degenerative changes and prior disc herniation. I think this would provide patient with reassurance and prepare for future interventional injections. I instructed her to let us  know if her pain increases, we can get her in quickly for injection. I will see her back for lumbar MRI review. She has no questions at this time. No red flag symptoms noted upon exam today.     Meds & Orders: No orders of the defined types were placed in this encounter.  No orders of the defined types were placed in this encounter.   Follow-up: Return for Lumbar MRI review.   Procedures: No procedures performed  Clinical History: MRI LUMBAR SPINE WITHOUT CONTRAST   TECHNIQUE: Multiplanar, multisequence MR imaging of the lumbar spine was performed. No intravenous contrast was administered.   COMPARISON:  CTA CT Chest, Abdomen, and Pelvis 03/15/2019.   FINDINGS: Segmentation:  Normal on the comparison.   Alignment: Stable lumbar lordosis since 2020. Subtle retrolisthesis of L4 on L5. Minimal  levoconvex lumbar spine curvature.   Vertebrae: No marrow edema or evidence of acute osseous abnormality. Normal background bone marrow signal. Intact visible sacrum and SI joints.   Conus medullaris and cauda equina: Conus extends to the T12-L1 level. No lower spinal cord or conus signal abnormality.   Paraspinal and other soft tissues: Stable, negative.   Disc levels:   T11-T12 through L1-L2 are negative for age.   L2-L3: Circumferential disc bulge with mild endplate spurring. Borderline to mild spinal stenosis. No convincing lateral recess or foraminal stenosis.   L3-L4: Circumferential disc bulge eccentric to the left with superimposed small central disc protrusion (series 6, image 26). Mild to moderate facet and ligament flavum hypertrophy with degenerative facet joint fluid. No spinal stenosis. Borderline to mild left lateral recess stenosis (left L4 nerve level) and up to moderate left L3 neural foraminal stenosis.   L4-L5: Mild retrolisthesis. Mildly lobulated midline and left lateral recess caudal disc extrusion, with small disc fragment abutting the descending left L5 nerve roots in the left lateral recess on series 7, image 35. Underlying mild disc bulging, facet and ligament flavum hypertrophy. Moderate to severe left lateral recess stenosis. No significant spinal or foraminal stenosis.   L5-S1: Negative disc aside from left subarticular annular fissure. No stenosis.   IMPRESSION: 1. Symptomatic level favored to be L4-L5, where a lobulated caudal disc extrusion extends into the left lateral recess. Query Left L5 radiculitis.   2. However, L3-L4 disc degeneration is also eccentric to the left, with up to mild left lateral recess and moderate left foraminal stenosis (left L4 and left L3 nerve levels, respectively).   3. Generally mild for age lumbar spine degeneration elsewhere. Mild multifactorial spinal stenosis at L2-L3.     Electronically Signed   By: Marlise Simpers M.D.   On: 07/20/2021 08:23   She reports that she has never smoked. She has never used smokeless tobacco. No results for input(s): "HGBA1C", "LABURIC" in the last 8760 hours.  Objective:  VS:  HT:    WT:   BMI:     BP:   HR: bpm  TEMP: ( )  RESP:  Physical Exam Vitals and nursing note reviewed.  HENT:     Head: Normocephalic and atraumatic.     Right Ear: External ear normal.     Left Ear: External ear normal.     Nose: Nose normal.     Mouth/Throat:     Mouth: Mucous membranes are moist.  Eyes:     Extraocular Movements: Extraocular movements intact.  Cardiovascular:     Pulses: Normal pulses.  Pulmonary:     Effort: Pulmonary effort is normal.  Abdominal:     General: Abdomen is flat. There is no distension.  Musculoskeletal:        General: Tenderness present.     Comments: Patient rises from seated position to standing without difficulty. Good lumbar range of motion. No pain noted with facet loading. 5/5 strength noted with bilateral hip flexion, knee flexion/extension, ankle dorsiflexion/plantarflexion and EHL. No clonus noted bilaterally. No pain upon palpation of greater trochanters. No pain with internal/external rotation of bilateral hips.  Sensation intact bilaterally. Negative slump test bilaterally. Ambulates without aid, gait steady.     Skin:    General: Skin is warm and dry.     Capillary Refill: Capillary refill takes less than 2 seconds.  Neurological:     General: No focal deficit present.     Mental Status: She is alert and oriented to person, place, and time.  Psychiatric:        Mood and Affect: Mood normal.        Behavior: Behavior normal.     Ortho Exam  Imaging: No results found.  Past Medical/Family/Surgical/Social History: Medications & Allergies reviewed per EMR, new medications updated. Patient Active Problem List   Diagnosis Date Noted   Hyperlipidemia 07/23/2022   Menopausal symptoms 10/20/2018   Hypercoagulable state (HCC)  08/19/2017   Iron deficiency anemia due to chronic blood loss 08/19/2017   Endometrial polyp 06/27/2017   Vaginal bleeding 06/27/2017   Central retinal artery occlusion 05/16/2017   Vaginal dryness, menopausal 08/08/2011   PERSISTENT DISORDER INITIATING/MAINTAINING SLEEP 02/20/2010   Anemia, unspecified 01/12/2007   Other premature beats 01/12/2007   History reviewed. No pertinent past medical history. History reviewed. No pertinent family history. History reviewed. No pertinent surgical history. Social History   Occupational History   Not on file  Tobacco Use   Smoking status: Never   Smokeless tobacco: Never  Substance and Sexual Activity   Alcohol use: Yes    Alcohol/week: 1.0 standard drink of alcohol    Types: 1 Glasses of wine per week    Comment: occas with dinner   Drug use: No   Sexual activity: Not on file

## 2023-08-15 NOTE — Progress Notes (Unsigned)
 Painscale: 3  Right handed  Sciatic portion of lower back, no numbness or tingling

## 2023-08-30 ENCOUNTER — Ambulatory Visit
Admission: RE | Admit: 2023-08-30 | Discharge: 2023-08-30 | Disposition: A | Discharge status: Home / Self Care | Source: Ambulatory Visit | Attending: Physical Medicine and Rehabilitation | Admitting: Physical Medicine and Rehabilitation

## 2023-08-30 DIAGNOSIS — M5116 Intervertebral disc disorders with radiculopathy, lumbar region: Secondary | ICD-10-CM

## 2023-08-30 DIAGNOSIS — G8929 Other chronic pain: Secondary | ICD-10-CM

## 2023-08-30 DIAGNOSIS — M47816 Spondylosis without myelopathy or radiculopathy, lumbar region: Secondary | ICD-10-CM

## 2023-08-30 DIAGNOSIS — M5416 Radiculopathy, lumbar region: Secondary | ICD-10-CM

## 2023-09-10 ENCOUNTER — Ambulatory Visit: Admitting: Orthopedic Surgery

## 2023-09-17 ENCOUNTER — Ambulatory Visit: Admitting: Physical Medicine and Rehabilitation

## 2023-09-22 ENCOUNTER — Ambulatory Visit: Payer: Self-pay | Admitting: Physical Medicine and Rehabilitation

## 2023-10-20 ENCOUNTER — Encounter: Payer: Self-pay | Admitting: Physical Medicine and Rehabilitation

## 2023-10-20 ENCOUNTER — Ambulatory Visit (INDEPENDENT_AMBULATORY_CARE_PROVIDER_SITE_OTHER): Admitting: Physical Medicine and Rehabilitation

## 2023-10-20 DIAGNOSIS — G8929 Other chronic pain: Secondary | ICD-10-CM

## 2023-10-20 DIAGNOSIS — M5442 Lumbago with sciatica, left side: Secondary | ICD-10-CM | POA: Diagnosis not present

## 2023-10-20 DIAGNOSIS — M48061 Spinal stenosis, lumbar region without neurogenic claudication: Secondary | ICD-10-CM

## 2023-10-20 DIAGNOSIS — M5416 Radiculopathy, lumbar region: Secondary | ICD-10-CM | POA: Diagnosis not present

## 2023-10-20 NOTE — Progress Notes (Unsigned)
 Kristen Maxwell - 68 y.o. female MRN [id number]  Date of birth: October 16, 1955  Office Visit Note: Visit Date: 10/20/2023 PCP: Tanda Katz, MD Referred by: Tanda Katz, MD  Subjective: Chief Complaint  Patient presents with   Lower Back - Pain, Follow-up   HPI: Kristen Maxwell is a 68 y.o. female who comes in today for evaluation of chronic left buttock pain, intermittent radiation of pain down left posteolateral leg. Pain ongoing for several years, worsens with prolonged sitting and standing. She describes her pain as sore and aching sensation, currently rates as 6 out of 10. Some relief of pain with home exercise regimen, rest and use of medications. She does take Tramadol  as needed that is prescribed by Dr. Ozell Dopp. She is currently undergoing chiropractic  treatments, has deep tissue massage once a month, also performs yoga daily. Recent lumbar MRI imaging shows previously demonstrated chronic disc extrusion extending caudally into the left L5 lateral recess has partially involuted. There is arthritis and lateral recess at L3-L4. Unchanged chronic bilateral L5 pars defects without significant anterolisthesis or foraminal narrowing at L5-S1. No high grade spinal canal stenosis noted. She underwent left L4-L5 interlaminar epidural steroid injection on 09/11/2021, she reports greater than 50% relief of pain with this injection for over a year. She is currently working part time as Teacher, early years/pre. Patient denies focal weakness, numbness and tingling. No recent trauma or falls.       Review of Systems  Musculoskeletal:  Positive for back pain.  Neurological:  Negative for tingling, sensory change, focal weakness and weakness.  All other systems reviewed and are negative.  Otherwise per HPI.  Assessment & Plan: Visit Diagnoses:    ICD-10-CM   1. Chronic left-sided low back pain with left-sided sciatica  M54.42 Ambulatory referral to Physical Medicine Rehab   G89.29      2. Lumbar radiculopathy  M54.16 Ambulatory referral to Physical Medicine Rehab    3. Stenosis of lateral recess of lumbar spine  M48.061 Ambulatory referral to Physical Medicine Rehab       Plan: Findings:  Chronic, worsening and severe left buttock pain, intermittent radiation down left posterolateral leg.  Patient continues to have severe pain despite good conservative therapy such as chiropractic treatments, yoga, home exercise regimen, rest and use of medications.  I discussed recent lumbar MRI with her today using imaging and spine model.  Previous left-sided disc extrusion at the level of L4-L5 has partially resolved.  There is no longer L5 nerve impingement at this level. She does have arthritis and lateral recess stenosis at L3-L4.  We discussed treatment plan in detail today.  Next step is to perform diagnostic and hopefully therapeutic left L4-L5 interlaminar epidural steroid injection under fluoroscopic guidance.  She is not currently taking anticoagulant medication.  If good relief of pain with injection we can repeat this procedure infrequently as needed.  She has no questions regarding injection at this time  I encouraged her to remain active and to continue with conservative treatments.  No red flag symptoms noted upon exam today.    Meds & Orders: No orders of the defined types were placed in this encounter.   Orders Placed This Encounter  Procedures   Ambulatory referral to Physical Medicine Rehab    Follow-up: Return for Left L4-L5 interlaminar epidural steroid injection.   Procedures: No procedures performed      Clinical History: CLINICAL DATA:  Low back pain with bilateral leg pain and numbness for 3 years. No  previous relevant surgery.   EXAM: MRI LUMBAR SPINE WITHOUT CONTRAST   TECHNIQUE: Multiplanar, multisequence MR imaging of the lumbar spine was performed. No intravenous contrast was administered.   COMPARISON:  Lumbar MRI 07/20/2021.    FINDINGS: Segmentation: Conventional anatomy assumed, with the last open disc space designated L5-S1.Concordant with prior imaging.   Alignment:  Minimal degenerative retrolisthesis at L4-5.   Vertebrae: Unchanged chronic bilateral L5 pars defects. There are scattered endplate degenerative changes. No evidence of acute fracture, discitis or osteomyelitis.   Conus medullaris: Extends to the L1 level. The conus and cauda equina appear normal.   Paraspinal and other soft tissues: No significant paraspinal findings.   Disc levels:   Sagittal images demonstrate no significant disc space findings within the visualized lower thoracic spine.   L1-2: Preserved disc height with mild disc bulging. No spinal stenosis or significant foraminal narrowing.   L2-3: Mildly progressive chronic loss of disc height with annular disc bulging and endplate osteophytes asymmetric to the right. Mild facet and ligamentous hypertrophy. Resulting mild spinal stenosis without significant foraminal narrowing or nerve root impingement.   L3-4: Mild loss of disc height with annular disc bulging and moderate facet and ligamentous hypertrophy. A previously demonstrated small left paracentral disc protrusion has mildly involuted. Unchanged mild spinal stenosis with mild lateral recess and foraminal narrowing bilaterally. No definite nerve root impingement.   L4-5: Mildly progressive chronic loss of disc height with annular disc bulging. Previously demonstrated chronic disc extrusion extending caudally into the left L5 lateral recess has partially involuted. No resulting left L5 nerve root displacement, spinal stenosis or significant foraminal narrowing.   L5-S1: Preserved disc height and hydration with a chronic left foraminal annular fissure. Despite the chronic L5 pars defects, no significant anterolisthesis or foraminal narrowing. The spinal canal remains widely patent.   IMPRESSION: 1. Compared  with previous MRI from 07/20/2021, there is mildly progressive disc degeneration at L2-3 and L4-5. Previously demonstrated chronic disc extrusion extending caudally into the left L5 lateral recess has partially involuted. No resulting left L5 nerve root displacement, spinal stenosis or significant foraminal narrowing. 2. Similar chronic spondylosis at L3-4 with mild spinal stenosis and mild lateral recess and foraminal narrowing bilaterally but no definite nerve root impingement. 3. Unchanged chronic bilateral L5 pars defects without significant anterolisthesis or foraminal narrowing at L5-S1. 4. No acute findings or clear explanation for the patient's symptoms.     Electronically Signed   By: Elsie Perone M.D.   On: 09/20/2023 13:09   She reports that she has never smoked. She has never used smokeless tobacco. No results for input(s): HGBA1C, LABURIC in the last 8760 hours.  Objective:  VS:  HT:    WT:   BMI:     BP:   HR: bpm  TEMP: ( )  RESP:  Physical Exam Vitals and nursing note reviewed.  HENT:     Head: Normocephalic and atraumatic.     Right Ear: External ear normal.     Left Ear: External ear normal.     Nose: Nose normal.     Mouth/Throat:     Mouth: Mucous membranes are moist.  Eyes:     Extraocular Movements: Extraocular movements intact.  Cardiovascular:     Rate and Rhythm: Normal rate.     Pulses: Normal pulses.  Pulmonary:     Effort: Pulmonary effort is normal.  Abdominal:     General: Abdomen is flat. There is no distension.  Musculoskeletal:  General: Tenderness present.     Cervical back: Normal range of motion.     Comments: Patient rises from seated position to standing without difficulty. Good lumbar range of motion. No pain noted with facet loading. 5/5 strength noted with bilateral hip flexion, knee flexion/extension, ankle dorsiflexion/plantarflexion and EHL. No clonus noted bilaterally. No pain upon palpation of greater  trochanters. No pain with internal/external rotation of bilateral hips. Sensation intact bilaterally. Negative slump test bilaterally. Ambulates without aid, gait steady.   Skin:    General: Skin is warm and dry.     Capillary Refill: Capillary refill takes less than 2 seconds.  Neurological:     General: No focal deficit present.     Mental Status: She is alert and oriented to person, place, and time.  Psychiatric:        Mood and Affect: Mood normal.        Behavior: Behavior normal.     Ortho Exam  Imaging: No results found.  Past Medical/Family/Surgical/Social History: Medications & Allergies reviewed per EMR, new medications updated. Patient Active Problem List   Diagnosis Date Noted   Hyperlipidemia 07/23/2022   Menopausal symptoms 10/20/2018   Hypercoagulable state (HCC) 08/19/2017   Iron deficiency anemia due to chronic blood loss 08/19/2017   Endometrial polyp 06/27/2017   Vaginal bleeding 06/27/2017   Central retinal artery occlusion 05/16/2017   Vaginal dryness, menopausal 08/08/2011   PERSISTENT DISORDER INITIATING/MAINTAINING SLEEP 02/20/2010   Anemia, unspecified 01/12/2007   Other premature beats 01/12/2007   No past medical history on file. No family history on file. No past surgical history on file. Social History   Occupational History   Not on file  Tobacco Use   Smoking status: Never   Smokeless tobacco: Never  Substance and Sexual Activity   Alcohol use: Yes    Alcohol/week: 1.0 standard drink of alcohol    Types: 1 Glasses of wine per week    Comment: occas with dinner   Drug use: No   Sexual activity: Not on file

## 2023-10-20 NOTE — Progress Notes (Unsigned)
 Pain Scale   Average Pain 6  147/69  Lumbar MRI Review

## 2023-11-05 ENCOUNTER — Ambulatory Visit: Admitting: Physical Medicine and Rehabilitation

## 2023-11-05 ENCOUNTER — Other Ambulatory Visit: Payer: Self-pay

## 2023-11-05 VITALS — BP 147/74 | HR 78

## 2023-11-05 DIAGNOSIS — M5416 Radiculopathy, lumbar region: Secondary | ICD-10-CM

## 2023-11-05 MED ORDER — METHYLPREDNISOLONE ACETATE 40 MG/ML IJ SUSP
40.0000 mg | Freq: Once | INTRAMUSCULAR | Status: AC
  Administered 2023-11-05 (×2): 40 mg

## 2023-11-05 NOTE — Patient Instructions (Signed)

## 2023-11-05 NOTE — Progress Notes (Unsigned)
 Pain Scale   Average Pain 8 Patient advising she has chronic lower back pain radiating to bilateral hip, pain increases in am then decreases when walking and moving.        +Driver, -BT, -Dye Allergies.

## 2023-11-11 ENCOUNTER — Encounter: Payer: Self-pay | Admitting: Physical Medicine and Rehabilitation

## 2023-11-12 NOTE — Procedures (Signed)
 Lumbar Epidural Steroid Injection - Interlaminar Approach with Fluoroscopic Guidance  Patient: Kristen Maxwell      Date of Birth: 07/01/1955 MRN: [id number] PCP: Tanda Katz, MD      Visit Date: 11/05/2023   Universal Protocol:     Consent Given By: the patient  Position: PRONE  Additional Comments: Vital signs were monitored before and after the procedure. Patient was prepped and draped in the usual sterile fashion. The correct patient, procedure, and site was verified.   Injection Procedure Details:   Procedure diagnoses: Lumbar radiculopathy [M54.16]   Meds Administered:  Meds ordered this encounter  Medications   methylPREDNISolone  acetate (DEPO-MEDROL ) injection 40 mg     Laterality: Left  Location/Site:  L4-5  Needle: 3.5 in., 20 ga. Tuohy  Needle Placement: Paramedian epidural  Findings:   -Comments: Excellent flow of contrast into the epidural space.  Procedure Details: Using a paramedian approach from the side mentioned above, the region overlying the inferior lamina was localized under fluoroscopic visualization and the soft tissues overlying this structure were infiltrated with 4 ml. of 1% Lidocaine  without Epinephrine. The Tuohy needle was inserted into the epidural space using a paramedian approach.   The epidural space was localized using loss of resistance along with counter oblique bi-planar fluoroscopic views.  After negative aspirate for air, blood, and CSF, a 2 ml. volume of Isovue-250 was injected into the epidural space and the flow of contrast was observed. Radiographs were obtained for documentation purposes.    The injectate was administered into the level noted above.   Additional Comments:  The patient tolerated the procedure well Dressing: 2 x 2 sterile gauze and Band-Aid    Post-procedure details: Patient was observed during the procedure. Post-procedure instructions were reviewed.  Patient left the clinic in stable  condition.

## 2023-11-12 NOTE — Progress Notes (Signed)
 Kristen Maxwell - 68 y.o. female MRN [id number]  Date of birth: 27-Mar-1955  Office Visit Note: Visit Date: 11/05/2023 PCP: Hughie Sharper, MD Referred by: Tanda Katz, MD  Subjective: Chief Complaint  Patient presents with   Lower Back - Pain   HPI:  Kristen Maxwell is a 68 y.o. female who comes in today at the request of Duwaine Pouch, FNP for planned Left L4-5 Lumbar Interlaminar epidural steroid injection with fluoroscopic guidance.  The patient has failed conservative care including home exercise, medications, time and activity modification.  This injection will be diagnostic and hopefully therapeutic.  Please see requesting physician notes for further details and justification.   ROS Otherwise per HPI.  Assessment & Plan: Visit Diagnoses:    ICD-10-CM   1. Lumbar radiculopathy  M54.16 XR C-ARM NO REPORT    Epidural Steroid injection    methylPREDNISolone  acetate (DEPO-MEDROL ) injection 40 mg      Plan: No additional findings.   Meds & Orders:  Meds ordered this encounter  Medications   methylPREDNISolone  acetate (DEPO-MEDROL ) injection 40 mg    Orders Placed This Encounter  Procedures   XR C-ARM NO REPORT   Epidural Steroid injection    Follow-up: Return for visit to requesting provider as needed.   Procedures: No procedures performed  Lumbar Epidural Steroid Injection - Interlaminar Approach with Fluoroscopic Guidance  Patient: Kristen Maxwell      Date of Birth: 07-09-1955 MRN: [id number] PCP: Tanda Katz, MD      Visit Date: 11/05/2023   Universal Protocol:     Consent Given By: the patient  Position: PRONE  Additional Comments: Vital signs were monitored before and after the procedure. Patient was prepped and draped in the usual sterile fashion. The correct patient, procedure, and site was verified.   Injection Procedure Details:   Procedure diagnoses: Lumbar radiculopathy [M54.16]   Meds Administered:  Meds  ordered this encounter  Medications   methylPREDNISolone  acetate (DEPO-MEDROL ) injection 40 mg     Laterality: Left  Location/Site:  L4-5  Needle: 3.5 in., 20 ga. Tuohy  Needle Placement: Paramedian epidural  Findings:   -Comments: Excellent flow of contrast into the epidural space.  Procedure Details: Using a paramedian approach from the side mentioned above, the region overlying the inferior lamina was localized under fluoroscopic visualization and the soft tissues overlying this structure were infiltrated with 4 ml. of 1% Lidocaine  without Epinephrine. The Tuohy needle was inserted into the epidural space using a paramedian approach.   The epidural space was localized using loss of resistance along with counter oblique bi-planar fluoroscopic views.  After negative aspirate for air, blood, and CSF, a 2 ml. volume of Isovue-250 was injected into the epidural space and the flow of contrast was observed. Radiographs were obtained for documentation purposes.    The injectate was administered into the level noted above.   Additional Comments:  The patient tolerated the procedure well Dressing: 2 x 2 sterile gauze and Band-Aid    Post-procedure details: Patient was observed during the procedure. Post-procedure instructions were reviewed.  Patient left the clinic in stable condition.   Clinical History: CLINICAL DATA:  Low back pain with bilateral leg pain and numbness for 3 years. No previous relevant surgery.   EXAM: MRI LUMBAR SPINE WITHOUT CONTRAST   TECHNIQUE: Multiplanar, multisequence MR imaging of the lumbar spine was performed. No intravenous contrast was administered.   COMPARISON:  Lumbar MRI 07/20/2021.   FINDINGS: Segmentation: Conventional anatomy assumed, with the  last open disc space designated L5-S1.Concordant with prior imaging.   Alignment:  Minimal degenerative retrolisthesis at L4-5.   Vertebrae: Unchanged chronic bilateral L5 pars defects. There  are scattered endplate degenerative changes. No evidence of acute fracture, discitis or osteomyelitis.   Conus medullaris: Extends to the L1 level. The conus and cauda equina appear normal.   Paraspinal and other soft tissues: No significant paraspinal findings.   Disc levels:   Sagittal images demonstrate no significant disc space findings within the visualized lower thoracic spine.   L1-2: Preserved disc height with mild disc bulging. No spinal stenosis or significant foraminal narrowing.   L2-3: Mildly progressive chronic loss of disc height with annular disc bulging and endplate osteophytes asymmetric to the right. Mild facet and ligamentous hypertrophy. Resulting mild spinal stenosis without significant foraminal narrowing or nerve root impingement.   L3-4: Mild loss of disc height with annular disc bulging and moderate facet and ligamentous hypertrophy. A previously demonstrated small left paracentral disc protrusion has mildly involuted. Unchanged mild spinal stenosis with mild lateral recess and foraminal narrowing bilaterally. No definite nerve root impingement.   L4-5: Mildly progressive chronic loss of disc height with annular disc bulging. Previously demonstrated chronic disc extrusion extending caudally into the left L5 lateral recess has partially involuted. No resulting left L5 nerve root displacement, spinal stenosis or significant foraminal narrowing.   L5-S1: Preserved disc height and hydration with a chronic left foraminal annular fissure. Despite the chronic L5 pars defects, no significant anterolisthesis or foraminal narrowing. The spinal canal remains widely patent.   IMPRESSION: 1. Compared with previous MRI from 07/20/2021, there is mildly progressive disc degeneration at L2-3 and L4-5. Previously demonstrated chronic disc extrusion extending caudally into the left L5 lateral recess has partially involuted. No resulting left L5 nerve root  displacement, spinal stenosis or significant foraminal narrowing. 2. Similar chronic spondylosis at L3-4 with mild spinal stenosis and mild lateral recess and foraminal narrowing bilaterally but no definite nerve root impingement. 3. Unchanged chronic bilateral L5 pars defects without significant anterolisthesis or foraminal narrowing at L5-S1. 4. No acute findings or clear explanation for the patient's symptoms.     Electronically Signed   By: Elsie Perone M.D.   On: 09/20/2023 13:09     Objective:  VS:  HT:    WT:   BMI:     BP:(!) 147/74  HR:78bpm  TEMP: ( )  RESP:  Physical Exam Vitals and nursing note reviewed.  Constitutional:      General: She is not in acute distress.    Appearance: Normal appearance. She is not ill-appearing.  HENT:     Head: Normocephalic and atraumatic.     Right Ear: External ear normal.     Left Ear: External ear normal.  Eyes:     Extraocular Movements: Extraocular movements intact.  Cardiovascular:     Rate and Rhythm: Normal rate.     Pulses: Normal pulses.  Pulmonary:     Effort: Pulmonary effort is normal. No respiratory distress.  Abdominal:     General: There is no distension.     Palpations: Abdomen is soft.  Musculoskeletal:        General: Tenderness present.     Cervical back: Neck supple.     Right lower leg: No edema.     Left lower leg: No edema.     Comments: Patient has good distal strength with no pain over the greater trochanters.  No clonus or focal weakness.  Skin:  Findings: No erythema, lesion or rash.  Neurological:     General: No focal deficit present.     Mental Status: She is alert and oriented to person, place, and time.     Sensory: No sensory deficit.     Motor: No weakness or abnormal muscle tone.     Coordination: Coordination normal.  Psychiatric:        Mood and Affect: Mood normal.        Behavior: Behavior normal.      Imaging: No results found.

## 2024-01-26 ENCOUNTER — Encounter: Payer: Self-pay | Admitting: Radiology

## 2024-02-16 ENCOUNTER — Other Ambulatory Visit: Payer: Self-pay | Admitting: Family Medicine

## 2024-02-16 DIAGNOSIS — N950 Postmenopausal bleeding: Secondary | ICD-10-CM

## 2024-03-03 ENCOUNTER — Inpatient Hospital Stay: Admission: RE | Admit: 2024-03-03 | Discharge: 2024-03-03 | Attending: Family Medicine | Admitting: Family Medicine

## 2024-03-03 DIAGNOSIS — N950 Postmenopausal bleeding: Secondary | ICD-10-CM
